# Patient Record
Sex: Female | Born: 1960
Health system: Southern US, Community
[De-identification: ages and names within clinical notes are randomized; demographics above are authoritative.]

## PROBLEM LIST (undated history)

## (undated) DIAGNOSIS — J45909 Unspecified asthma, uncomplicated: Secondary | ICD-10-CM

## (undated) DIAGNOSIS — E876 Hypokalemia: Secondary | ICD-10-CM

## (undated) DIAGNOSIS — D72819 Decreased white blood cell count, unspecified: Secondary | ICD-10-CM

## (undated) DIAGNOSIS — F419 Anxiety disorder, unspecified: Secondary | ICD-10-CM

## (undated) DIAGNOSIS — D649 Anemia, unspecified: Secondary | ICD-10-CM

## (undated) DIAGNOSIS — M7989 Other specified soft tissue disorders: Secondary | ICD-10-CM

## (undated) DIAGNOSIS — K5900 Constipation, unspecified: Secondary | ICD-10-CM

## (undated) DIAGNOSIS — I493 Ventricular premature depolarization: Secondary | ICD-10-CM

## (undated) DIAGNOSIS — I10 Essential (primary) hypertension: Secondary | ICD-10-CM

## (undated) DIAGNOSIS — R002 Palpitations: Secondary | ICD-10-CM

## (undated) HISTORY — DX: Constipation, unspecified: K59.00

## (undated) HISTORY — DX: Anxiety disorder, unspecified: F41.9

## (undated) HISTORY — DX: Unspecified asthma, uncomplicated: J45.909

## (undated) HISTORY — DX: Hypokalemia: E87.6

## (undated) HISTORY — DX: Anemia, unspecified: D64.9

## (undated) HISTORY — DX: Ventricular premature depolarization: I49.3

## (undated) HISTORY — DX: Other specified soft tissue disorders: M79.89

## (undated) HISTORY — DX: Essential (primary) hypertension: I10

## (undated) HISTORY — DX: Palpitations: R00.2

## (undated) HISTORY — DX: Decreased white blood cell count, unspecified: D72.819

---

## 1998-03-19 ENCOUNTER — Encounter: Payer: Self-pay | Admitting: *Deleted

## 1998-03-19 ENCOUNTER — Ambulatory Visit (HOSPITAL_COMMUNITY): Admission: RE | Admit: 1998-03-19 | Discharge: 1998-03-19 | Payer: Self-pay | Admitting: *Deleted

## 1998-12-25 ENCOUNTER — Other Ambulatory Visit: Admission: RE | Admit: 1998-12-25 | Discharge: 1998-12-25 | Payer: Self-pay | Admitting: Obstetrics and Gynecology

## 1999-12-31 ENCOUNTER — Other Ambulatory Visit: Admission: RE | Admit: 1999-12-31 | Discharge: 1999-12-31 | Payer: Self-pay | Admitting: Obstetrics and Gynecology

## 2000-03-08 ENCOUNTER — Encounter: Payer: Self-pay | Admitting: Obstetrics and Gynecology

## 2000-03-08 ENCOUNTER — Encounter: Admission: RE | Admit: 2000-03-08 | Discharge: 2000-03-08 | Payer: Self-pay | Admitting: Obstetrics and Gynecology

## 2000-03-10 ENCOUNTER — Encounter: Payer: Self-pay | Admitting: Obstetrics and Gynecology

## 2000-03-10 ENCOUNTER — Encounter: Admission: RE | Admit: 2000-03-10 | Discharge: 2000-03-10 | Payer: Self-pay | Admitting: Obstetrics and Gynecology

## 2001-03-13 ENCOUNTER — Other Ambulatory Visit: Admission: RE | Admit: 2001-03-13 | Discharge: 2001-03-13 | Payer: Self-pay | Admitting: Obstetrics and Gynecology

## 2001-03-16 ENCOUNTER — Encounter: Admission: RE | Admit: 2001-03-16 | Discharge: 2001-03-16 | Payer: Self-pay | Admitting: Obstetrics and Gynecology

## 2001-03-16 ENCOUNTER — Encounter: Payer: Self-pay | Admitting: Obstetrics and Gynecology

## 2001-06-18 ENCOUNTER — Encounter: Payer: Self-pay | Admitting: Family Medicine

## 2001-06-18 ENCOUNTER — Ambulatory Visit (HOSPITAL_COMMUNITY): Admission: RE | Admit: 2001-06-18 | Discharge: 2001-06-18 | Payer: Self-pay | Admitting: Family Medicine

## 2002-03-22 ENCOUNTER — Encounter: Payer: Self-pay | Admitting: Obstetrics and Gynecology

## 2002-03-22 ENCOUNTER — Encounter: Admission: RE | Admit: 2002-03-22 | Discharge: 2002-03-22 | Payer: Self-pay | Admitting: Obstetrics and Gynecology

## 2002-03-26 ENCOUNTER — Other Ambulatory Visit: Admission: RE | Admit: 2002-03-26 | Discharge: 2002-03-26 | Payer: Self-pay | Admitting: Obstetrics and Gynecology

## 2003-04-02 ENCOUNTER — Encounter: Admission: RE | Admit: 2003-04-02 | Discharge: 2003-04-02 | Payer: Self-pay | Admitting: Obstetrics and Gynecology

## 2003-04-09 ENCOUNTER — Other Ambulatory Visit: Admission: RE | Admit: 2003-04-09 | Discharge: 2003-04-09 | Payer: Self-pay | Admitting: Obstetrics and Gynecology

## 2004-04-02 ENCOUNTER — Encounter: Admission: RE | Admit: 2004-04-02 | Discharge: 2004-04-02 | Payer: Self-pay | Admitting: Obstetrics and Gynecology

## 2004-04-15 ENCOUNTER — Other Ambulatory Visit: Admission: RE | Admit: 2004-04-15 | Discharge: 2004-04-15 | Payer: Self-pay | Admitting: Obstetrics and Gynecology

## 2005-04-04 ENCOUNTER — Encounter: Admission: RE | Admit: 2005-04-04 | Discharge: 2005-04-04 | Payer: Self-pay | Admitting: Obstetrics and Gynecology

## 2005-05-12 ENCOUNTER — Other Ambulatory Visit: Admission: RE | Admit: 2005-05-12 | Discharge: 2005-05-12 | Payer: Self-pay | Admitting: Obstetrics and Gynecology

## 2006-04-21 ENCOUNTER — Encounter: Admission: RE | Admit: 2006-04-21 | Discharge: 2006-04-21 | Payer: Self-pay | Admitting: Obstetrics and Gynecology

## 2007-04-27 ENCOUNTER — Encounter: Admission: RE | Admit: 2007-04-27 | Discharge: 2007-04-27 | Payer: Self-pay | Admitting: Obstetrics and Gynecology

## 2008-05-02 ENCOUNTER — Encounter: Admission: RE | Admit: 2008-05-02 | Discharge: 2008-05-02 | Payer: Self-pay | Admitting: Obstetrics and Gynecology

## 2009-05-04 ENCOUNTER — Encounter: Admission: RE | Admit: 2009-05-04 | Discharge: 2009-05-04 | Payer: Self-pay | Admitting: Obstetrics and Gynecology

## 2009-09-18 ENCOUNTER — Ambulatory Visit (HOSPITAL_COMMUNITY): Admission: RE | Admit: 2009-09-18 | Discharge: 2009-09-18 | Payer: Self-pay | Admitting: Obstetrics and Gynecology

## 2009-12-15 HISTORY — PX: ENDOMETRIAL ABLATION: SHX621

## 2010-01-01 ENCOUNTER — Ambulatory Visit (HOSPITAL_COMMUNITY): Admission: RE | Admit: 2010-01-01 | Discharge: 2010-01-01 | Payer: Self-pay | Admitting: Obstetrics and Gynecology

## 2010-02-14 HISTORY — PX: OTHER SURGICAL HISTORY: SHX169

## 2010-03-31 ENCOUNTER — Other Ambulatory Visit: Payer: Self-pay | Admitting: Obstetrics and Gynecology

## 2010-03-31 DIAGNOSIS — Z1231 Encounter for screening mammogram for malignant neoplasm of breast: Secondary | ICD-10-CM

## 2010-04-27 LAB — BASIC METABOLIC PANEL
BUN: 10 mg/dL (ref 6–23)
Chloride: 102 mEq/L (ref 96–112)
Glucose, Bld: 95 mg/dL (ref 70–99)

## 2010-04-27 LAB — CBC
HCT: 35.2 % — ABNORMAL LOW (ref 36.0–46.0)
MCH: 29.6 pg (ref 26.0–34.0)
MCV: 87.3 fL (ref 78.0–100.0)
Platelets: 229 10*3/uL (ref 150–400)
RBC: 4.03 MIL/uL (ref 3.87–5.11)
RDW: 14.8 % (ref 11.5–15.5)

## 2010-04-27 LAB — PREGNANCY, URINE: Preg Test, Ur: NEGATIVE

## 2010-05-01 LAB — CBC
Hemoglobin: 11.3 g/dL — ABNORMAL LOW (ref 12.0–15.0)
MCH: 30.2 pg (ref 26.0–34.0)
MCHC: 34.6 g/dL (ref 30.0–36.0)
MCV: 87.3 fL (ref 78.0–100.0)
RBC: 3.73 MIL/uL — ABNORMAL LOW (ref 3.87–5.11)
RDW: 14.3 % (ref 11.5–15.5)
WBC: 3.8 10*3/uL — ABNORMAL LOW (ref 4.0–10.5)

## 2010-05-01 LAB — BASIC METABOLIC PANEL
BUN: 13 mg/dL (ref 6–23)
CO2: 28 mEq/L (ref 19–32)
Chloride: 105 mEq/L (ref 96–112)
Creatinine, Ser: 0.64 mg/dL (ref 0.4–1.2)
GFR calc Af Amer: >60 mL/min (ref >60)
Sodium: 136 mEq/L (ref 135–145)

## 2010-05-06 ENCOUNTER — Ambulatory Visit
Admission: RE | Admit: 2010-05-06 | Discharge: 2010-05-06 | Disposition: A | Discharge status: Home / Self Care | Payer: 59 | Source: Ambulatory Visit | Attending: Obstetrics and Gynecology | Admitting: Obstetrics and Gynecology

## 2010-05-06 DIAGNOSIS — Z1231 Encounter for screening mammogram for malignant neoplasm of breast: Secondary | ICD-10-CM

## 2011-03-29 ENCOUNTER — Other Ambulatory Visit: Payer: Self-pay | Admitting: Obstetrics and Gynecology

## 2011-03-29 DIAGNOSIS — Z1231 Encounter for screening mammogram for malignant neoplasm of breast: Secondary | ICD-10-CM

## 2011-05-16 ENCOUNTER — Ambulatory Visit
Admission: RE | Admit: 2011-05-16 | Discharge: 2011-05-16 | Disposition: A | Discharge status: Home / Self Care | Payer: 59 | Source: Ambulatory Visit | Attending: Obstetrics and Gynecology | Admitting: Obstetrics and Gynecology

## 2011-05-16 DIAGNOSIS — Z1231 Encounter for screening mammogram for malignant neoplasm of breast: Secondary | ICD-10-CM

## 2011-06-30 ENCOUNTER — Telehealth: Payer: Self-pay | Admitting: Oncology

## 2011-06-30 NOTE — Telephone Encounter (Signed)
S/w the pt to schedule an appt for her with dr ha. Pt was unaware of this and wants to speak with her primary care physician first before setting up any appts with [place]. gve the pt my name and phone number to call me back

## 2011-07-01 ENCOUNTER — Telehealth: Payer: Self-pay | Admitting: Oncology

## 2011-07-01 NOTE — Telephone Encounter (Signed)
Del.07/01/11 Ref. Dr. Cain Saupe Dx.Anemia

## 2011-07-01 NOTE — Telephone Encounter (Signed)
S/w the pt and she is aware of her new pt appt with dr ha °

## 2011-07-04 ENCOUNTER — Encounter: Payer: Self-pay | Admitting: Oncology

## 2011-07-04 ENCOUNTER — Ambulatory Visit: Payer: 59

## 2011-07-04 ENCOUNTER — Telehealth: Payer: Self-pay | Admitting: Oncology

## 2011-07-04 ENCOUNTER — Other Ambulatory Visit (HOSPITAL_BASED_OUTPATIENT_CLINIC_OR_DEPARTMENT_OTHER): Payer: 59 | Admitting: Lab

## 2011-07-04 ENCOUNTER — Ambulatory Visit (HOSPITAL_BASED_OUTPATIENT_CLINIC_OR_DEPARTMENT_OTHER): Payer: 59 | Admitting: Oncology

## 2011-07-04 VITALS — BP 155/79 | HR 59 | Temp 97.0°F | Ht 63.0 in | Wt 160.6 lb

## 2011-07-04 DIAGNOSIS — E876 Hypokalemia: Secondary | ICD-10-CM

## 2011-07-04 DIAGNOSIS — D649 Anemia, unspecified: Secondary | ICD-10-CM

## 2011-07-04 DIAGNOSIS — J309 Allergic rhinitis, unspecified: Secondary | ICD-10-CM | POA: Insufficient documentation

## 2011-07-04 DIAGNOSIS — D72819 Decreased white blood cell count, unspecified: Secondary | ICD-10-CM

## 2011-07-04 DIAGNOSIS — I10 Essential (primary) hypertension: Secondary | ICD-10-CM

## 2011-07-04 DIAGNOSIS — D72829 Elevated white blood cell count, unspecified: Secondary | ICD-10-CM

## 2011-07-04 LAB — CBC & DIFF AND RETIC
Immature Retic Fract: 3.3 % (ref 1.60–10.00)
MCH: 28.5 pg (ref 25.1–34.0)
MCV: 83.4 fL (ref 79.5–101.0)
MONO#: 0.5 10*3/uL (ref 0.1–0.9)
RBC: 4.04 10*6/uL (ref 3.70–5.45)
RDW: 15.3 % — ABNORMAL HIGH (ref 11.2–14.5)
Retic %: 1.31 % (ref 0.70–2.10)
Retic Ct Abs: 52.92 10*3/uL (ref 33.70–90.70)
lymph#: 1.6 10*3/uL (ref 0.9–3.3)

## 2011-07-04 LAB — MORPHOLOGY

## 2011-07-04 LAB — CHCC SMEAR

## 2011-07-04 NOTE — Progress Notes (Signed)
New patient today, with 1 insurance patient was informed about the financial assistance program etc; patient at this time did not need assistance, patient did ask if her insurance would be billed first and then the remaining balance would come to her for todays visit, I did tell that is what is normally done since she didn't have a co-pay.

## 2011-07-04 NOTE — Progress Notes (Signed)
Please see consult note; dated same day.   

## 2011-07-04 NOTE — Telephone Encounter (Signed)
appts made and printed for pt aom °

## 2011-07-04 NOTE — Patient Instructions (Signed)
A.  Issue:  Mild anemia:  Chronic.   - I sent work up to rule out reversible causes of anemia. - My nurse will contact you within one week if there are thinks that we can do to improve your anemia, according to blood work today.  B.  Follow up: - Lab-only appointment at the [facility] in 2 and then 4 months. - Follow up with Belenda Cruise, my nurse-practitioner in about 6 months.  - We may consider diagnostic bone marrow biopsy in the future if your anemia or WBC significantly worsen.  At this time, bone marrow biopsy has low clinical yield due to only mild low blood counts.

## 2011-07-04 NOTE — Consult Note (Signed)
[facility]  [phone] [phone]     INITIAL HEMATOLOGY CONSULTATION    Referral MD:  Dr. Cain Saupe, M.D.  Reason for Referral: anemia and leukopenia.     HPI:   [name] is a 51 year-old [place] American woman with history of HTN.  She recently switched PCP to Dr. Jillyn Hidden who realized that patient had mild anemia and leukopenia.  Oldest CBC provided for my review dated from 09/11/2009 where WBC was 3.8; Hgb 11.3; Plt 220.  The most recent CBC was from 06/27/2011 where WBC was 2.9; Hgb 11.3; Plt 186.  She was kindly referred to the [facility] for evaluation.  Ms. [name] presented to the clinic by herself today.  She reported that she feels well.  She denies fever, recurrent infection, visible source of bleeding.  She works out 7 days a week including Zumba.  She denies SOB, chest pain, dizziness with work out.  Patient denies fatigue, headache, visual changes, confusion, drenching night sweats, palpable lymph node swelling, mucositis, odynophagia, dysphagia, nausea vomiting, jaundice, chest pain, palpitation, shortness of breath, dyspnea on exertion, productive cough, gum bleeding, epistaxis, hematemesis, hemoptysis, abdominal pain, abdominal swelling, early satiety, melena, hematochezia, hematuria, skin rash, spontaneous bleeding, joint swelling, joint pain, heat or cold intolerance, bowel bladder incontinence, back pain, focal motor weakness, paresthesia.     Past Medical History  Diagnosis Date  . HTN (hypertension)   . Allergic rhinitis   . Hypokalemia   . Anemia   :    Past Surgical History  Procedure Date  . Endometrial ablation 12/2009  . Cesarean section     x2  . Colonoscopy 2012    reportedly negative per patient  :   CURRENT MEDS: Current Outpatient Prescriptions  Medication Sig Dispense Refill  . amLODipine (NORVASC) 5 MG tablet Take 5 mg by mouth daily.      . Biotin 1000 MCG tablet Take  1,000 mcg by mouth daily.      . calcium carbonate (OS-CAL) 600 MG TABS Take 600 mg by mouth 2 (two) times daily with a meal.      . COCONUT OIL PO Take by mouth daily.      . Evening Primrose Oil CAPS Take by mouth daily.      Marland Kitchen FLAXSEED, LINSEED, PO Take by mouth daily.      . Multiple Vitamin (MULTIVITAMIN) tablet Take 1 tablet by mouth daily.      Marland Kitchen spironolactone (ALDACTONE) 25 MG tablet Take 12.5 mg by mouth daily.          No Known Allergies:  Family History  Problem Relation Age of Onset  . Hypertension Mother   . Diabetes Mother   . Heart disease Mother   . Cancer Father 64    prostate  . Heart disease Sister   . Hypertension Sister   . Heart disease Brother   . Cancer Paternal Uncle     colon  :  History   Social History  . Marital Status: Single    Spouse Name: N/A    Number of Children: 2  . Years of Education: N/A   Occupational History  .  [employer]   Social History Main Topics  . Smoking status: Never Smoker   . Smokeless tobacco: Never Used  . Alcohol Use: No  . Drug Use: No  . Sexually Active:    Other Topics Concern  . Not on file  Social History Narrative  . No narrative on file  :  REVIEW OF SYSTEM:  The rest of the 14-point review of sytem was negative.   Exam: ECOG 0  General:  well-nourished woman, in no acute distress.  Eyes:  no scleral icterus.  ENT:  There were no oropharyngeal lesions.  Neck was without thyromegaly.  Lymphatics:  Negative cervical, supraclavicular or axillary adenopathy.  Respiratory: lungs were clear bilaterally without wheezing or crackles.  Cardiovascular:  Regular rate and rhythm, S1/S2, without murmur, rub or gallop.  There was no pedal edema.  GI:  abdomen was soft, flat, nontender, nondistended, without organomegaly.  Muscoloskeletal:  no spinal tenderness of palpation of vertebral spine.  There was no swelling or pain in palpation of her finger joints.  Skin exam was without  echymosis, petichae or rash.   Neuro exam was nonfocal.  Patient was able to get on and off exam table without assistance.  Gait was normal.  Patient was alerted and oriented.  Attention was good.   Language was appropriate.  Mood was normal without depression.  Speech was not pressured.  Thought content was not tangential.    LABS:  Lab Results  Component Value Date   WBC 3.6* 07/04/2011   HGB 11.5* 07/04/2011   HCT 33.7* 07/04/2011   PLT 222 07/04/2011   GLUCOSE 95 12/29/2009   NA 136 12/29/2009   K 3.5 12/29/2009   CL 102 12/29/2009   CREATININE 0.70 12/29/2009   BUN 10 12/29/2009   CO2 28 12/29/2009   Iron 35; % sat 11; Ferritin 40; Vit B12:  1278.   Blood smear review:   I personally reviewed the patient's peripheral blood smear today.  There was isocytosis.  There was no peripheral blast.  There was no schistocytosis, spherocytosis, target cell, rouleaux formation, tear drop cell.  There was no giant platelets or platelet clumps.      ASSESSMENT AND PLAN:   1.  Hypertension:  Slightly elevated SBP today.  Per her report, her normal blood pressure is <120 systolic.  This is most likely white coat due to 1st time at the [facility].  She is on amlodipine and spironolactone per PCP.   2.  Normocytic anemia:  She has no family history of hemoglobinopathy.  Her iron panel and Vit B12 today are not consistent with deficiency.  She reportedly had a negative colonoscopy in 2012.  Her retic count today was normal making less likely to be hemolysis or primary bone marrow failure.   She has a distant relative with SLE.  Clinically, she has no signs and symptoms of SLE.  However, in the future, if her cytopenia worsens, I may consider screening for SLE.  I have low clinical suspicion for bone marrow failure process such as aplasia or MDS since her cytopenia is so mild.  A diagnostic bone marrow biopsy at this time would be of low clinical utility.  If her Hgb significantly worsens to <10  without obvious explanation, then a diagnostic bone marrow biopsy may be considered at that time.   3.  Leukopenia:  Most likely benign since it has been on going for at least 2 years.  My review of her blood smear today was rather benign.  Again, if her neutropenia worsens to <1, I may consider further work up.   4.  Follow up: - CBC here at the Cancer Center in 2 and 4 months.  She has follow up with Clenton Pare, NP in about  6 months.    Thank you for this referral.    The length of time of the face-to-face encounter was 30 minutes. More than 50% of time was spent counseling and coordination of care.

## 2011-07-06 LAB — IRON AND TIBC
%SAT: 11 % — ABNORMAL LOW (ref 20–55)
Iron: 35 ug/dL — ABNORMAL LOW (ref 42–145)
TIBC: 330 ug/dL (ref 250–470)

## 2011-07-06 LAB — PROTEIN ELECTROPHORESIS, SERUM: Albumin ELP: 56 % (ref 55.8–66.1)

## 2011-07-06 LAB — FERRITIN: Ferritin: 40 ng/mL (ref 10–291)

## 2011-09-05 ENCOUNTER — Other Ambulatory Visit: Payer: 59 | Admitting: Lab

## 2011-11-07 ENCOUNTER — Other Ambulatory Visit: Payer: 59 | Admitting: Lab

## 2012-01-04 ENCOUNTER — Other Ambulatory Visit (HOSPITAL_BASED_OUTPATIENT_CLINIC_OR_DEPARTMENT_OTHER): Payer: 59 | Admitting: Lab

## 2012-01-04 ENCOUNTER — Ambulatory Visit (HOSPITAL_BASED_OUTPATIENT_CLINIC_OR_DEPARTMENT_OTHER): Payer: 59 | Admitting: Oncology

## 2012-01-04 ENCOUNTER — Encounter: Payer: Self-pay | Admitting: Oncology

## 2012-01-04 ENCOUNTER — Telehealth: Payer: Self-pay | Admitting: Oncology

## 2012-01-04 VITALS — BP 181/90 | HR 63 | Temp 97.5°F | Resp 20 | Ht 63.0 in | Wt 159.9 lb

## 2012-01-04 DIAGNOSIS — I10 Essential (primary) hypertension: Secondary | ICD-10-CM

## 2012-01-04 DIAGNOSIS — D649 Anemia, unspecified: Secondary | ICD-10-CM

## 2012-01-04 DIAGNOSIS — E876 Hypokalemia: Secondary | ICD-10-CM

## 2012-01-04 DIAGNOSIS — D72819 Decreased white blood cell count, unspecified: Secondary | ICD-10-CM

## 2012-01-04 HISTORY — DX: Decreased white blood cell count, unspecified: D72.819

## 2012-01-04 LAB — CBC WITH DIFFERENTIAL/PLATELET
BASO%: 0.3 % (ref 0.0–2.0)
EOS%: 1 % (ref 0.0–7.0)
LYMPH%: 42.4 % (ref 14.0–49.7)
MCH: 28.9 pg (ref 25.1–34.0)
MCHC: 33.7 g/dL (ref 31.5–36.0)
MCV: 85.8 fL (ref 79.5–101.0)
MONO%: 13.1 % (ref 0.0–14.0)
Platelets: 199 10*3/uL (ref 145–400)
RBC: 4.29 10*6/uL (ref 3.70–5.45)
RDW: 14.4 % (ref 11.2–14.5)

## 2012-01-04 LAB — COMPREHENSIVE METABOLIC PANEL (CC13)
BUN: 13 mg/dL (ref 7.0–26.0)
CO2: 32 mEq/L — ABNORMAL HIGH (ref 22–29)
Creatinine: 0.8 mg/dL (ref 0.6–1.1)
Glucose: 86 mg/dl (ref 70–99)
Sodium: 142 mEq/L (ref 136–145)
Total Bilirubin: 0.64 mg/dL (ref 0.20–1.20)
Total Protein: 7.4 g/dL (ref 6.4–8.3)

## 2012-01-04 LAB — MORPHOLOGY

## 2012-01-04 NOTE — Telephone Encounter (Signed)
appts made and printed for pt  °

## 2012-01-04 NOTE — Patient Instructions (Addendum)
1.  Diagnosis:  Anemia and leukopenia (low white blood cell) 2.  Impression:  Stable; chronic.  Most likely benign with low clinical suspicion for primary bone marrow disease.  3.  Recommendation:  Watchful observation.  In the future, if there is significantly worsened low white blood cells, anemia, or low platelet count, then we may consider diagnostic bone marrow biopsy.  4.  Follow up:  Lab only appointment in about 6 months.  Return visit in about 1 year. In the future, if blood count remains low but stable, we may consider discharging from the Cancer Center with follow up with primary care physician alone.

## 2012-01-04 NOTE — Progress Notes (Signed)
[facility]  Telephone:(336) [phone] Fax:(336) [phone]   OFFICE PROGRESS NOTE   Cc:  FULP, CAMMIE, MD  DIAGNOSIS: leukopenia; most likely benign.  PAST THERAPY:  None.   CURRENT THERAPY:  Watchful observation.   INTERVAL HISTORY: Stephanie White 51 y.o. female returns for regular follow up by herself.  She reports mild fatigue.  However, she still works out 3x/week with zumba.    Patient denies fever, anorexia, weight loss, headache, visual changes, confusion, drenching night sweats, palpable lymph node swelling, mucositis, odynophagia, dysphagia, nausea vomiting, jaundice, chest pain, palpitation, shortness of breath, dyspnea on exertion, productive cough, gum bleeding, epistaxis, hematemesis, hemoptysis, abdominal pain, abdominal swelling, early satiety, melena, hematochezia, hematuria, skin rash, spontaneous bleeding, joint swelling, joint pain, heat or cold intolerance, bowel bladder incontinence, back pain, focal motor weakness, paresthesia, depression, suicidal or homicidal ideation, feeling hopelessness.   Past Medical History  Diagnosis Date  . HTN (hypertension)   . Allergic rhinitis   . Hypokalemia   . Anemia   . Leukopenia 01/04/2012    Past Surgical History  Procedure Date  . Endometrial ablation 12/2009  . Cesarean section     x2  . Colonoscopy 2012    reportedly negative per patient    Current Outpatient Prescriptions  Medication Sig Dispense Refill  . amLODipine (NORVASC) 5 MG tablet Take 5 mg by mouth daily.      . Biotin 1000 MCG tablet Take 1,000 mcg by mouth daily.      Marland Kitchen FLAXSEED, LINSEED, PO Take by mouth daily.      . Multiple Vitamin (MULTIVITAMIN) tablet Take 1 tablet by mouth daily.      Marland Kitchen spironolactone (ALDACTONE) 25 MG tablet Take 12.5 mg by mouth daily.        ALLERGIES:   has no known allergies.  REVIEW OF SYSTEMS:  The rest of the 14-point review of system was negative.   Filed Vitals:   01/04/12 0927  BP:  181/90  Pulse: 63  Temp: 97.5 F (36.4 C)  Resp: 20   Wt Readings from Last 3 Encounters:  01/04/12 159 lb 14.4 oz (72.53 kg)  07/04/11 160 lb 9.6 oz (72.848 kg)   ECOG Performance status: 0  PHYSICAL EXAMINATION:   General:  well-nourished woman, in no acute distress.  Eyes:  no scleral icterus.  ENT:  There were no oropharyngeal lesions.  Neck was without thyromegaly.  Lymphatics:  Negative cervical, supraclavicular or axillary adenopathy.  Respiratory: lungs were clear bilaterally without wheezing or crackles.  Cardiovascular:  Regular rate and rhythm, S1/S2, without murmur, rub or gallop.  There was no pedal edema.  GI:  abdomen was soft, flat, nontender, nondistended, without organomegaly.  Muscoloskeletal:  no spinal tenderness of palpation of vertebral spine.  Skin exam was without echymosis, petichae.  Neuro exam was nonfocal.  Patient was able to get on and off exam table without assistance.  Gait was normal.  Patient was alerted and oriented.  Attention was good.   Language was appropriate.  Mood was normal without depression.  Speech was not pressured.  Thought content was not tangential.      LABORATORY/RADIOLOGY DATA:  Lab Results  Component Value Date   WBC 2.9* 01/04/2012   HGB 12.4 01/04/2012   HCT 36.8 01/04/2012   PLT 199 01/04/2012   GLUCOSE 86 01/04/2012   ALKPHOS 72 01/04/2012   ALT 21 01/04/2012   AST 34 01/04/2012   NA 142 01/04/2012   K 3.3* 01/04/2012  CL 104 01/04/2012   CREATININE 0.8 01/04/2012   BUN 13.0 01/04/2012   CO2 32* 01/04/2012   I personally reviewed the patient's peripheral blood smear today.  There was isocytosis.  There was no peripheral blast.  There was no schistocytosis, spherocytosis, target cell, rouleaux formation, tear drop cell.  There was no giant platelets or platelet clumps.      ASSESSMENT AND PLAN:    1.  Diagnosis:  Leukopenia  2.  Impression:  Stable; chronic.  Most likely benign with low clinical suspicion for  primary bone marrow disease.  3.  Recommendation:  Watchful observation.  In the future, if there is significantly worsened leukopenia/neutropenia, anemia, or thrombocytopenia, then we may consider diagnostic bone marrow biopsy.  A bone marrow biopsy at this time has low clinical utility in my opinion.  4.  Follow up:  Lab only appointment in about 6 months.  Return visit in about 1 year. In the future, if blood count remains low but stable, we may consider discharging from the Cancer Center with follow up with primary care physician alone.   Patient expressed informed understanding and agreed with watchful observation at this time.    The length of time of the face-to-face encounter was 10  minutes. More than 50% of time was spent counseling and coordination of care.

## 2012-04-09 ENCOUNTER — Other Ambulatory Visit (HOSPITAL_COMMUNITY): Payer: Self-pay | Admitting: Otolaryngology

## 2012-04-09 DIAGNOSIS — R599 Enlarged lymph nodes, unspecified: Secondary | ICD-10-CM

## 2012-04-10 ENCOUNTER — Other Ambulatory Visit: Payer: Self-pay | Admitting: Radiology

## 2012-04-13 ENCOUNTER — Ambulatory Visit (HOSPITAL_COMMUNITY)
Admission: RE | Admit: 2012-04-13 | Discharge: 2012-04-13 | Disposition: A | Discharge status: Home / Self Care | Payer: 59 | Source: Ambulatory Visit | Attending: Otolaryngology | Admitting: Otolaryngology

## 2012-04-13 ENCOUNTER — Other Ambulatory Visit: Payer: Self-pay | Admitting: Family Medicine

## 2012-04-13 ENCOUNTER — Ambulatory Visit (HOSPITAL_COMMUNITY): Admission: RE | Admit: 2012-04-13 | Payer: 59 | Source: Ambulatory Visit

## 2012-04-13 ENCOUNTER — Other Ambulatory Visit: Payer: Self-pay

## 2012-04-13 DIAGNOSIS — Z1231 Encounter for screening mammogram for malignant neoplasm of breast: Secondary | ICD-10-CM

## 2012-04-13 DIAGNOSIS — N951 Menopausal and female climacteric states: Secondary | ICD-10-CM

## 2012-04-13 DIAGNOSIS — R599 Enlarged lymph nodes, unspecified: Secondary | ICD-10-CM | POA: Insufficient documentation

## 2012-04-13 MED ORDER — FENTANYL CITRATE 0.05 MG/ML IJ SOLN
INTRAMUSCULAR | Status: AC
  Filled 2012-04-13: qty 4

## 2012-04-13 MED ORDER — MIDAZOLAM HCL 2 MG/2ML IJ SOLN
INTRAMUSCULAR | Status: AC
  Filled 2012-04-13: qty 4

## 2012-04-20 ENCOUNTER — Other Ambulatory Visit (HOSPITAL_COMMUNITY): Payer: 59

## 2012-05-18 ENCOUNTER — Ambulatory Visit
Admission: RE | Admit: 2012-05-18 | Discharge: 2012-05-18 | Disposition: A | Discharge status: Home / Self Care | Payer: 59 | Source: Ambulatory Visit | Attending: Family Medicine | Admitting: Family Medicine

## 2012-05-18 DIAGNOSIS — Z1231 Encounter for screening mammogram for malignant neoplasm of breast: Secondary | ICD-10-CM

## 2012-05-18 DIAGNOSIS — N951 Menopausal and female climacteric states: Secondary | ICD-10-CM

## 2012-07-04 ENCOUNTER — Other Ambulatory Visit: Payer: 59

## 2013-01-01 ENCOUNTER — Telehealth: Payer: Self-pay | Admitting: Oncology

## 2013-01-01 NOTE — Telephone Encounter (Signed)
pt called to cx appt....did not want to r/s °

## 2013-01-02 ENCOUNTER — Ambulatory Visit: Payer: 59 | Admitting: Hematology and Oncology

## 2013-01-02 ENCOUNTER — Other Ambulatory Visit: Payer: 59 | Admitting: Lab

## 2013-01-16 ENCOUNTER — Telehealth: Payer: Self-pay | Admitting: *Deleted

## 2013-01-16 NOTE — Telephone Encounter (Signed)
Pt left VM,  She is former pt of Dr. Gaylyn Rong. States her PCP, Dr. Cain Saupe, suggested she make appt to see Hematologist again and said they sent over copy of recent labs to Dr. Bertis Ruddy for review.  Have not received labs yet.  Left pt VM that I will give labs to Dr. Bertis Ruddy when we receive them and then will call her about appt.Marland Kitchen

## 2013-01-17 ENCOUNTER — Telehealth: Payer: Self-pay | Admitting: *Deleted

## 2013-01-17 NOTE — Telephone Encounter (Signed)
CBC reviewed from Dr [facility] office and forwarded to Dr. Bertis Ruddy for review.

## 2013-01-17 NOTE — Telephone Encounter (Signed)
OK - thanks

## 2013-01-18 ENCOUNTER — Telehealth: Payer: Self-pay | Admitting: Hematology and Oncology

## 2013-01-18 NOTE — Telephone Encounter (Signed)
pt rs from 1212 to 1218 shh

## 2013-01-18 NOTE — Telephone Encounter (Signed)
[facility] appt made w Dr Bertis Ruddy for 12/12 per 12/5 POF shh

## 2013-01-18 NOTE — Telephone Encounter (Signed)
Per Dr. Bertis Ruddy,  Schedule pt for routine f/u.  Left pt Vm informing her of order to schedule for office visit w/i one to two weeks.  Expect call from Scheduling and call us back if any questions.

## 2013-01-25 ENCOUNTER — Ambulatory Visit: Payer: 59 | Admitting: Hematology and Oncology

## 2013-01-31 ENCOUNTER — Ambulatory Visit (HOSPITAL_BASED_OUTPATIENT_CLINIC_OR_DEPARTMENT_OTHER): Payer: 59 | Admitting: Hematology and Oncology

## 2013-01-31 ENCOUNTER — Encounter: Payer: Self-pay | Admitting: Hematology and Oncology

## 2013-01-31 VITALS — BP 151/71 | HR 98 | Temp 97.6°F | Resp 19 | Ht 63.0 in | Wt 163.6 lb

## 2013-01-31 DIAGNOSIS — D72819 Decreased white blood cell count, unspecified: Secondary | ICD-10-CM

## 2013-01-31 DIAGNOSIS — D649 Anemia, unspecified: Secondary | ICD-10-CM

## 2013-01-31 DIAGNOSIS — D509 Iron deficiency anemia, unspecified: Secondary | ICD-10-CM

## 2013-01-31 NOTE — Progress Notes (Signed)
[facility] OFFICE PROGRESS NOTE  FULP, CAMMIE, MD DIAGNOSIS:  Chronic leukopenia  SUMMARY OF HEMATOLOGIC HISTORY: This is a patient who has chronic leukopenia without history of recurrent infection. She was being observed. INTERVAL HISTORY: Stephanie White 52 y.o. female returns for further followup. She denies any recent infection. She denies any recent fever, chills, night sweats or abnormal weight loss She was recently placed on oral iron supplement due to mild anemia. Her recent colonoscopy 3 years ago was negative. The patient denies any recent signs or symptoms of bleeding such as spontaneous epistaxis, hematuria or hematochezia.  I have reviewed the past medical history, past surgical history, social history and family history with the patient and they are unchanged from previous note.  ALLERGIES:  is allergic to latex.  MEDICATIONS:  Current Outpatient Prescriptions  Medication Sig Dispense Refill  . EXFORGE 10-160 MG per tablet Take 1 tablet by mouth daily.      . ferrous sulfate 325 (65 FE) MG tablet Take 325 mg by mouth every other day.      . Fish Oil OIL by Does not apply route 2 (two) times daily.      . Multiple Vitamin (MULTIVITAMIN) tablet Take 1 tablet by mouth daily.       No current facility-administered medications for this visit.     REVIEW OF SYSTEMS:   Constitutional: Denies fevers, chills or night sweats Behavioral/Psych: Mood is stable, no new changes  All other systems were reviewed with the patient and are negative.  PHYSICAL EXAMINATION: ECOG PERFORMANCE STATUS: 0 - Asymptomatic  Filed Vitals:   01/31/13 1406  BP: 151/71  Pulse: 98  Temp: 97.6 F (36.4 C)  Resp: 19   Filed Weights   01/31/13 1406  Weight: 163 lb 9.6 oz (74.208 kg)    GENERAL:alert, no distress and comfortable SKIN: skin color, texture, turgor are normal, no rashes or significant lesions EYES: normal, Conjunctiva are pink and non-injected, sclera  clear Musculoskeletal:no cyanosis of digits and no clubbing  NEURO: alert & oriented x 3 with fluent speech, no focal motor/sensory deficits  LABORATORY DATA:  I have reviewed the data as listed No results found for this or any previous visit (from the past 48 hour(s)).  Lab Results  Component Value Date   WBC 2.9* 01/04/2012   HGB 12.4 01/04/2012   HCT 36.8 01/04/2012   MCV 85.8 01/04/2012   PLT 199 01/04/2012   ASSESSMENT & PLAN:  #1 chronic leukopenia This is likely due to her African American heritage. I do not recommend further workup as the patient is not symptomatic #2 history of mild iron deficiency Causes is unknown. Her anemia has improved with iron replacement. I recommend a recheck ferritin in 3 months and discontinue if her ferritin level is more than 50. I educated the patient's signs and symptoms to watch out for infection. The patient does not need to return for further followup appointment. All questions were answered. The patient knows to call the clinic with any problems, questions or concerns. No barriers to learning was detected.  I spent 15 minutes counseling the patient face to face. The total time spent in the appointment was 20 minutes and more than 50% was on counseling.     [facility], Payeton Germani, MD 01/31/2013 2:46 PM

## 2013-04-16 ENCOUNTER — Other Ambulatory Visit: Payer: Self-pay

## 2013-04-16 DIAGNOSIS — Z1231 Encounter for screening mammogram for malignant neoplasm of breast: Secondary | ICD-10-CM

## 2013-05-04 ENCOUNTER — Encounter: Payer: Self-pay | Admitting: Cardiology

## 2013-05-16 ENCOUNTER — Ambulatory Visit: Payer: 59 | Admitting: Cardiology

## 2013-05-22 ENCOUNTER — Ambulatory Visit
Admission: RE | Admit: 2013-05-22 | Discharge: 2013-05-22 | Disposition: A | Discharge status: Home / Self Care | Payer: 59 | Source: Ambulatory Visit

## 2013-05-22 DIAGNOSIS — Z1231 Encounter for screening mammogram for malignant neoplasm of breast: Secondary | ICD-10-CM

## 2013-05-27 ENCOUNTER — Encounter: Payer: Self-pay | Admitting: Cardiology

## 2013-05-27 ENCOUNTER — Ambulatory Visit (INDEPENDENT_AMBULATORY_CARE_PROVIDER_SITE_OTHER): Payer: 59 | Admitting: Cardiology

## 2013-05-27 VITALS — BP 130/70 | HR 57 | Ht 63.0 in | Wt 170.0 lb

## 2013-05-27 DIAGNOSIS — R9431 Abnormal electrocardiogram [ECG] [EKG]: Secondary | ICD-10-CM

## 2013-05-27 DIAGNOSIS — R064 Hyperventilation: Secondary | ICD-10-CM

## 2013-05-27 DIAGNOSIS — F411 Generalized anxiety disorder: Secondary | ICD-10-CM

## 2013-05-27 DIAGNOSIS — F419 Anxiety disorder, unspecified: Secondary | ICD-10-CM

## 2013-05-27 DIAGNOSIS — R002 Palpitations: Secondary | ICD-10-CM

## 2013-05-27 DIAGNOSIS — I10 Essential (primary) hypertension: Secondary | ICD-10-CM

## 2013-05-27 NOTE — Progress Notes (Signed)
[place]. [address], [place], [place]  [address] Phone: [phone] Fax:  [phone]  Date:  05/27/2013   ID:  Stephanie White, DOB April 16, 1960, MRN [id number]  PCP:  Antony Blackbird, MD   History of Present Illness: Stephanie White is a 53 y.o. female here for evaluation of palpitations, described as a fluttering sensation in her chest. In review of Dr. Siri Cole note on 04/16/13 she was feeling palpitations as well as numbness in her upper arm and tingling that occurs sometimes at work, stressful even though she is sitting behind computer. When she has these episodes, sometimes she feels as though she is not breathing. She had episode of waking up feeling nauseous. She was concerned about possible heart attack after watching an episode of Dr. Irena Cords.  Potassium 3.4, creatinine 0.91, H. pylori negative, hemoglobin 12.1, TSH 1.8.   Wt Readings from Last 3 Encounters:  05/27/13 170 lb (77.111 kg)  01/31/13 163 lb 9.6 oz (74.208 kg)  01/04/12 159 lb 14.4 oz (72.53 kg)     Past Medical History  Diagnosis Date  . HTN (hypertension)   . Allergic rhinitis   . Hypokalemia   . Anemia   . Leukopenia 01/04/2012    Past Surgical History  Procedure Laterality Date  . Endometrial ablation  12/2009  . Cesarean section      x2  . Colonoscopy  2012    reportedly negative per patient    Current Outpatient Prescriptions  Medication Sig Dispense Refill  . EXFORGE 10-160 MG per tablet Take 1 tablet by mouth daily.      . ferrous sulfate 325 (65 FE) MG tablet Take 325 mg by mouth every other day.      . Fish Oil OIL by Does not apply route 2 (two) times daily.      . Multiple Vitamin (MULTIVITAMIN) tablet Take 1 tablet by mouth daily.       No current facility-administered medications for this visit.   FHX: Brother had CHF.  Allergies:    Allergies  Allergen Reactions  . Latex Rash    Social History:  The patient  reports that she has never smoked. She has never used  smokeless tobacco. She reports that she does not drink alcohol or use illicit drugs.   ROS:  Please see the history of present illness.   Denies any fevers, chills, orthopnea, PND  . No chest pain, rashes, syncope, orthopnea. Unless explained above, all other review of systems negative.  PHYSICAL EXAM: VS:  BP 130/70  Pulse 57  Ht 5\' 3"  (1.6 m)  Wt 170 lb (77.111 kg)  BMI 30.12 kg/m2 Well nourished, well developed, in no acute distress HEENT: normalNCAT Neck: no JVDNo bruits, carotid. Cardiac:  normal S1, S2; RRR; no murmur Lungs:  clear to auscultation bilaterally, no wheezing, rhonchi or rales Abd: soft, nontender, no hepatomegaly Ext: no edemaNormal pulse bilaterally Skin: warm and dry Neuro: no focal abnormalities noted, moves all extremities, normal gait GU: Deferred Rectal: Deferred  EKG:  05/27/13 -Sinus bradycardia rate 57 with nonspecific ST-T wave changes, subtle T-wave inversion in lead V2 and flattening in the remainder of the precordial leads.     Lab work-reviewed as above. Prior medical records reviewed.  ASSESSMENT AND PLAN:  1. Palpitations-likely anxiety related. She describes the sensation of perioral numbness or paresthesias that often can accompany hyperventilation. After discussing this with her, she does admit that she feels as though during these episodes that she  is not breathing or breathing correctly. She will focus on deep breathing/relaxation patterns. If palpitations worsen, consider monitoring. Her EKG showing subtle abnormalities as described above, I will check an exercise treadmill test to ensure that there no signs of overt ischemia. She does exercise fairly often and does not have any exertional symptoms. 2. Anxiety/stress - as above. Likely hyperventilation causing perioral numbness. 3. Abnormal ECG - as above.  4. Hypertension - well-controlled.  Signed, Candee Furbish, MD [facility]  05/27/2013 3:03 PM

## 2013-05-27 NOTE — Patient Instructions (Signed)
Your physician recommends that you continue on your current medications as directed. Please refer to the Current Medication list given to you today.  Your physician has requested that you have an exercise tolerance test. For further information please visit [email or web address]. Please also follow instruction sheet, as given.  Your physician recommends that you schedule a follow-up as needed

## 2013-07-03 ENCOUNTER — Encounter: Payer: 59 | Admitting: Physician Assistant

## 2014-01-08 ENCOUNTER — Ambulatory Visit (INDEPENDENT_AMBULATORY_CARE_PROVIDER_SITE_OTHER): Payer: 59 | Admitting: Podiatry

## 2014-01-08 ENCOUNTER — Encounter: Payer: Self-pay | Admitting: Podiatry

## 2014-01-08 ENCOUNTER — Ambulatory Visit: Payer: Self-pay | Admitting: Podiatry

## 2014-01-08 ENCOUNTER — Ambulatory Visit (INDEPENDENT_AMBULATORY_CARE_PROVIDER_SITE_OTHER): Payer: 59

## 2014-01-08 DIAGNOSIS — M779 Enthesopathy, unspecified: Secondary | ICD-10-CM

## 2014-01-08 DIAGNOSIS — R52 Pain, unspecified: Secondary | ICD-10-CM

## 2014-01-08 DIAGNOSIS — M7741 Metatarsalgia, right foot: Secondary | ICD-10-CM

## 2014-01-08 DIAGNOSIS — M7742 Metatarsalgia, left foot: Secondary | ICD-10-CM

## 2014-01-08 DIAGNOSIS — M722 Plantar fascial fibromatosis: Secondary | ICD-10-CM

## 2014-01-08 NOTE — Progress Notes (Signed)
   Subjective:    Patient ID: Stephanie White, female    DOB: 1960/12/26, 53 y.o.   MRN: [id number]  HPI  53 year old female presents the office today with complaints of bilateral foot pain which has been ongoing for approximately one month. She states that she has pain in the ball of her feet as well as her heels. She states that she has tried multiple pairs of shoes to help alleviate her symptoms however she continues to have pain. She is active in exercise although she has not increased her activity over the past couple months. She denies any history of injury or trauma to the area. She states that the right is worse than the left. She denies ever having any swelling or any increase in warmth to the feet. She states that she has pain in her heels particularly in the morning or after periods of rest and then again after prolonged activity. She states that she has pain in the ball of her foot with pressure and ambulation. No other complaints at this time.    Review of Systems  Musculoskeletal: Positive for back pain.       Objective:   Physical Exam AAO x3, NAD DP/PT pulses palpable bilaterally, CRT less than 3 seconds Protective sensation intact with Simms Weinstein monofilament, vibratory sensation intact, Achilles tendon reflex intact Tenderness is patient of the plantar medial tubercle of the calcaneus at the insertion the plantar fascia bilaterally. There is no pain with lateral compression of the calcaneus or the vibratory sensation. There is no pain along the course of the posterior aspect of the calcaneus or along the course/insertion of the Achilles tendon. There is no pain along the course of the plantar fascia within the arch of the foot. There is diffuse tenderness to bilateral metatarsal heads 1 through 5 plantarly. There is no overlying edema, erythema, increase in warmth of bilateral foot/ankle. There is mild tenderness along the course of posterior tibial tendon bilaterally  along the insertion into the navicular. Mild decrease in medial arch height upon weightbearing. Mild equinus. MMT 5/5, ROM WNL No pain with calf compression, swelling, warmth, erythema. No open lesions or pre-ulcerative lesions.       Assessment & Plan:  53 year old female with bilateral heel pain, likely plantar fasciitis and diffuse bilateral plantar metatarsal head pain. -X-rays were obtained and reviewed with the patient. -Treatment options were discussed including alternatives, risks, complications. -Discussed the patient possible stress fracture. She does not want any form of immobilization at this time. -Discussed injection to the area however she does not wish to proceed with any steroid injection. She also does not want any oral anti-inflammatory medications. -Discussed with her stretching exercises as well as ice to the area. -Dispensed plantar fascial brace. -Follow-up in 2 weeks. In the meantime, call the office in the questions, concerns, changes symptoms. We'll obtain repeat x-rays next appointment.

## 2014-01-08 NOTE — Patient Instructions (Signed)
Plantar Fasciitis (Heel Spur Syndrome) with Rehab The plantar fascia is a fibrous, ligament-like, soft-tissue structure that spans the bottom of the foot. Plantar fasciitis is a condition that causes pain in the foot due to inflammation of the tissue. SYMPTOMS   Pain and tenderness on the underneath side of the foot.  Pain that worsens with standing or walking. CAUSES  Plantar fasciitis is caused by irritation and injury to the plantar fascia on the underneath side of the foot. Common mechanisms of injury include:  Direct trauma to bottom of the foot.  Damage to a small nerve that runs under the foot where the main fascia attaches to the heel bone.  Stress placed on the plantar fascia due to bone spurs. RISK INCREASES WITH:   Activities that place stress on the plantar fascia (running, jumping, pivoting, or cutting).  Poor strength and flexibility.  Improperly fitted shoes.  Tight calf muscles.  Flat feet.  Failure to warm-up properly before activity.  Obesity. PREVENTION  Warm up and stretch properly before activity.  Allow for adequate recovery between workouts.  Maintain physical fitness:  Strength, flexibility, and endurance.  Cardiovascular fitness.  Maintain a health body weight.  Avoid stress on the plantar fascia.  Wear properly fitted shoes, including arch supports for individuals who have flat feet. PROGNOSIS  If treated properly, then the symptoms of plantar fasciitis usually resolve without surgery. However, occasionally surgery is necessary. RELATED COMPLICATIONS   Recurrent symptoms that may result in a chronic condition.  Problems of the lower back that are caused by compensating for the injury, such as limping.  Pain or weakness of the foot during push-off following surgery.  Chronic inflammation, scarring, and partial or complete fascia tear, occurring more often from repeated injections. TREATMENT  Treatment initially involves the use of  ice and medication to help reduce pain and inflammation. The use of strengthening and stretching exercises may help reduce pain with activity, especially stretches of the Achilles tendon. These exercises may be performed at home or with a therapist. Your caregiver may recommend that you use heel cups of arch supports to help reduce stress on the plantar fascia. Occasionally, corticosteroid injections are given to reduce inflammation. If symptoms persist for greater than 6 months despite non-surgical (conservative), then surgery may be recommended.  MEDICATION   If pain medication is necessary, then nonsteroidal anti-inflammatory medications, such as aspirin and ibuprofen, or other minor pain relievers, such as acetaminophen, are often recommended.  Do not take pain medication within 7 days before surgery.  Prescription pain relievers may be given if deemed necessary by your caregiver. Use only as directed and only as much as you need.  Corticosteroid injections may be given by your caregiver. These injections should be reserved for the most serious cases, because they may only be given a certain number of times. HEAT AND COLD  Cold treatment (icing) relieves pain and reduces inflammation. Cold treatment should be applied for 10 to 15 minutes every 2 to 3 hours for inflammation and pain and immediately after any activity that aggravates your symptoms. Use ice packs or massage the area with a piece of ice (ice massage).  Heat treatment may be used prior to performing the stretching and strengthening activities prescribed by your caregiver, physical therapist, or athletic trainer. Use a heat pack or soak the injury in warm water. SEEK IMMEDIATE MEDICAL CARE IF:  Treatment seems to offer no benefit, or the condition worsens.  Any medications produce adverse side effects. EXERCISES RANGE   OF MOTION (ROM) AND STRETCHING EXERCISES - Plantar Fasciitis (Heel Spur Syndrome) These exercises may help you  when beginning to rehabilitate your injury. Your symptoms may resolve with or without further involvement from your physician, physical therapist or athletic trainer. While completing these exercises, remember:   Restoring tissue flexibility helps normal motion to return to the joints. This allows healthier, less painful movement and activity.  An effective stretch should be held for at least 30 seconds.  A stretch should never be painful. You should only feel a gentle lengthening or release in the stretched tissue. RANGE OF MOTION - Toe Extension, Flexion  Sit with your right / left leg crossed over your opposite knee.  Grasp your toes and gently pull them back toward the top of your foot. You should feel a stretch on the bottom of your toes and/or foot.  Hold this stretch for __________ seconds.  Now, gently pull your toes toward the bottom of your foot. You should feel a stretch on the top of your toes and or foot.  Hold this stretch for __________ seconds. Repeat __________ times. Complete this stretch __________ times per day.  RANGE OF MOTION - Ankle Dorsiflexion, Active Assisted  Remove shoes and sit on a chair that is preferably not on a carpeted surface.  Place right / left foot under knee. Extend your opposite leg for support.  Keeping your heel down, slide your right / left foot back toward the chair until you feel a stretch at your ankle or calf. If you do not feel a stretch, slide your bottom forward to the edge of the chair, while still keeping your heel down.  Hold this stretch for __________ seconds. Repeat __________ times. Complete this stretch __________ times per day.  STRETCH - Gastroc, Standing  Place hands on wall.  Extend right / left leg, keeping the front knee somewhat bent.  Slightly point your toes inward on your back foot.  Keeping your right / left heel on the floor and your knee straight, shift your weight toward the wall, not allowing your back to  arch.  You should feel a gentle stretch in the right / left calf. Hold this position for __________ seconds. Repeat __________ times. Complete this stretch __________ times per day. STRETCH - Soleus, Standing  Place hands on wall.  Extend right / left leg, keeping the other knee somewhat bent.  Slightly point your toes inward on your back foot.  Keep your right / left heel on the floor, bend your back knee, and slightly shift your weight over the back leg so that you feel a gentle stretch deep in your back calf.  Hold this position for __________ seconds. Repeat __________ times. Complete this stretch __________ times per day. STRETCH - Gastrocsoleus, Standing  Note: This exercise can place a lot of stress on your foot and ankle. Please complete this exercise only if specifically instructed by your caregiver.   Place the ball of your right / left foot on a step, keeping your other foot firmly on the same step.  Hold on to the wall or a rail for balance.  Slowly lift your other foot, allowing your body weight to press your heel down over the edge of the step.  You should feel a stretch in your right / left calf.  Hold this position for __________ seconds.  Repeat this exercise with a slight bend in your right / left knee. Repeat __________ times. Complete this stretch __________ times per day.    STRENGTHENING EXERCISES - Plantar Fasciitis (Heel Spur Syndrome)  These exercises may help you when beginning to rehabilitate your injury. They may resolve your symptoms with or without further involvement from your physician, physical therapist or athletic trainer. While completing these exercises, remember:   Muscles can gain both the endurance and the strength needed for everyday activities through controlled exercises.  Complete these exercises as instructed by your physician, physical therapist or athletic trainer. Progress the resistance and repetitions only as guided. STRENGTH -  Towel Curls  Sit in a chair positioned on a non-carpeted surface.  Place your foot on a towel, keeping your heel on the floor.  Pull the towel toward your heel by only curling your toes. Keep your heel on the floor.  If instructed by your physician, physical therapist or athletic trainer, add ____________________ at the end of the towel. Repeat __________ times. Complete this exercise __________ times per day. STRENGTH - Ankle Inversion  Secure one end of a rubber exercise band/tubing to a fixed object (table, pole). Loop the other end around your foot just before your toes.  Place your fists between your knees. This will focus your strengthening at your ankle.  Slowly, pull your big toe up and in, making sure the band/tubing is positioned to resist the entire motion.  Hold this position for __________ seconds.  Have your muscles resist the band/tubing as it slowly pulls your foot back to the starting position. Repeat __________ times. Complete this exercises __________ times per day.  Document Released: 01/31/2005 Document Revised: 04/25/2011 Document Reviewed: 05/15/2008 [employer] Patient Information 2015 [employer]. This information is not intended to replace advice given to you by your health care provider. Make sure you discuss any questions you have with your health care provider.  

## 2014-01-25 ENCOUNTER — Ambulatory Visit (INDEPENDENT_AMBULATORY_CARE_PROVIDER_SITE_OTHER): Payer: 59

## 2014-01-25 ENCOUNTER — Ambulatory Visit (INDEPENDENT_AMBULATORY_CARE_PROVIDER_SITE_OTHER): Payer: 59 | Admitting: Podiatry

## 2014-01-25 ENCOUNTER — Encounter: Payer: Self-pay | Admitting: Podiatry

## 2014-01-25 VITALS — BP 141/75 | HR 58 | Resp 16

## 2014-01-25 DIAGNOSIS — M7741 Metatarsalgia, right foot: Secondary | ICD-10-CM

## 2014-01-25 DIAGNOSIS — M722 Plantar fascial fibromatosis: Secondary | ICD-10-CM

## 2014-01-25 DIAGNOSIS — M7742 Metatarsalgia, left foot: Secondary | ICD-10-CM

## 2014-01-25 DIAGNOSIS — M8430XA Stress fracture, unspecified site, initial encounter for fracture: Secondary | ICD-10-CM

## 2014-01-27 NOTE — Progress Notes (Addendum)
Patient ID: Stephanie White, female   DOB: 08-23-1960, 53 y.o.   MRN: [id number]  Subjective:  53 year old female returns the office today for follow-up evaluation of bilateral heel pain, forefoot pain. She states that she has been icing the feet since last appointment and she's had some decrease in symptoms however she does continue to have some "discomfort but not pain like it was". No other complaints at this time. No acute changes since last appointment. Denies any systemic complaints such as fevers, chills, nausea, vomiting.  Objective: AAO x3, NAD DP/PT pulses palpable bilaterally, CRT less than 3 seconds Protective sensation intact with Simms Weinstein monofilament, vibratory sensation intact, Achilles tendon reflex intact There is no overlying edema, erythema, increased warmth to bilateral feet. There is mild tenderness to palpation over the plantar medial tubercle of the calcaneus at the insertion the plantar fascia bilaterally. There is no pain along the course of plantar fascial in the arch of the foot and plantar fascia appears to be intact. There is no pain with lateral compression of the calcaneus or pain with vibratory sensation bilaterally. There is no pain on the posterior aspect of the calcaneus or along the course/insertion of the Achilles tendon. There is mild discomfort on the plantar metatarsal heads bilaterally although this is decreased compared to prior. On the left foot there is slight discomfort overlying third metatarsal and mild discomfort with vibratory sensation. She states it is an achy feeling subjectively. MMT 5/5, ROM WNL No open lesions or pre-ulcerative lesions. No pain with calf compression, swelling, warmth, erythema.  Assessment: 53 year old female with bilateral plantar fasciitis, metatarsalgia, possible stress fracture left third metatarsal proximally.  Plan: -Treatment options were discussed including alternatives, risks, complications. -X-rays were  obtained and reviewed. On the left third metatarsal base there is a sclerotic line which is new compared to prior x-rays and there is mild discomfort over this area. She has recently started working more on the treadmill. Due to the discomfort over this area of possible changes on x-ray will immobilize and a surgical shoe at this time. Discussed the patient this may be a sign of early stress fracture. -Patient does not want any steroid injections. -Recommended OTC NSAIDs as the patient does not want prescription. Discussed side effects the medication and directed to stop immediately should any occur. -Continue stretching exercises. Recommended to hold off on the left side but can continue on the right.  -Ice to the area -Patient was inquiring about custom shoes (she likes the diabetic shoes). I discussed with her the shoes are likely not covered however she would like to try. A prescription was given to the patient to go to [facility] for evaluation.  -Follow-up in 2-3 weeks, or sooner should any problems arise, or any change in symptoms. In the meantime, call the office with any questions, concerns, change in symptoms.

## 2014-01-29 ENCOUNTER — Encounter: Payer: 59 | Admitting: Podiatry

## 2014-02-12 ENCOUNTER — Encounter: Payer: Self-pay | Admitting: Podiatry

## 2014-02-12 ENCOUNTER — Ambulatory Visit (INDEPENDENT_AMBULATORY_CARE_PROVIDER_SITE_OTHER): Payer: 59 | Admitting: Podiatry

## 2014-02-12 ENCOUNTER — Ambulatory Visit (INDEPENDENT_AMBULATORY_CARE_PROVIDER_SITE_OTHER): Payer: 59

## 2014-02-12 VITALS — BP 152/74 | HR 63 | Resp 18

## 2014-02-12 DIAGNOSIS — R52 Pain, unspecified: Secondary | ICD-10-CM

## 2014-02-12 DIAGNOSIS — M7742 Metatarsalgia, left foot: Secondary | ICD-10-CM

## 2014-02-12 DIAGNOSIS — M7741 Metatarsalgia, right foot: Secondary | ICD-10-CM

## 2014-02-12 DIAGNOSIS — M722 Plantar fascial fibromatosis: Secondary | ICD-10-CM

## 2014-02-14 HISTORY — PX: BREAST BIOPSY: SHX20

## 2014-02-14 NOTE — Progress Notes (Signed)
Patient ID: Stephanie White, female   DOB: [birth year], 54 y.o.   MRN: [id number]  Subjective: 54 year old female returns the office today for follow up evaluation of bilateral foot pain and for possible stress fracture left third metatarsal. The patient states that since last appointment she has been doing well her pain has significantly decreased in both of her feet. She continues to have some mild intermittent discomfort at times. Since last appointment she does state that she has purchased new shoes which has helped. She is also had custom orthotics made however she states that they had been adjusted and she is still not happy with them. She had the [facility] and she hasn't appointment to go back today for further modifications. She denies any recent injury or trauma to the area. She states that she has started to work out again. Denies any acute changes since last appointment and no other complaints at this time. Denies any systemic complaints such as fevers, chills, nausea, vomiting.  Objective: AAO x3, NAD DP/PT pulses palpable bilaterally, CRT less than 3 seconds Protective sensation intact with Simms Weinstein monofilament, vibratory sensation intact, Achilles tendon reflex intact There is no overlying edema, erythema or increase in warmth to bilateral lower extremities. There is no tenderness to palpation overlying the plantar medial tubercle of the calcaneus at the insertion of the plantar fascial bilaterally. There is no pain along the course of plantar fascial in the arch of the foot and it appears to be intact. There is no pain with lateral compression of the calcaneus or pain with vibratory sensation. No pain along the posterior aspect of the calcaneus or along the course last insertion of the Achilles tendon. There is no tenderness overlying the plantar forefoot overlying the metatarsal heads. Prominent metatarsal heads plantarly with mild atrophy of the fat pad. There is no areas of  pinpoint bony tenderness or pain with vibratory sensation to bilateral feet. No pain overlying the third metatarsal.  MMT 5/5, ROM WNL No open lesions or pre-ulcerative lesions. No pain with calf compression, swelling, warmth, erythema.  Assessment: 54 year old female with resolving bilateral heel pain, likely plantar fasciitis; metatarsalgia due to prominent metatarsal heads; will not stress fracture left third metatarsal  Plan : -X-rays were obtained and reviewed with the patient. There is no signs of fracture or stress fracture at this time. -Treatment options were discussed the patient include alternatives, risks, complication. -The patient's orthotics to appear to have a large metatarsal pad which is uncomfortable for the patient. She also states that in the heel area it is too hard. I recommended a metatarsal bar be shaved down as well as a cut out for the heel and a prescription was written for this and given to the patient she has an appointment to have them modified today [facility]. -Discussed the patient as her symptoms resolve she can gradually increase her exercise however would not recommend doing this suddenly. -Continue ice and elevation. -Patient wishes to hold off on any kind of medications. -Follow-up once the orthotics have been adjusted and she's had an appropriate break-in period. Recommend follow-up in the proximal before to 6 weeks or sooner should any palms arise. In the meantime, call the office in the questions, concerns, change in symptoms.

## 2014-05-26 ENCOUNTER — Other Ambulatory Visit: Payer: Self-pay

## 2014-05-26 DIAGNOSIS — Z1231 Encounter for screening mammogram for malignant neoplasm of breast: Secondary | ICD-10-CM

## 2014-05-28 ENCOUNTER — Ambulatory Visit
Admission: RE | Admit: 2014-05-28 | Discharge: 2014-05-28 | Disposition: A | Discharge status: Home / Self Care | Payer: 59 | Source: Ambulatory Visit

## 2014-05-28 DIAGNOSIS — Z1231 Encounter for screening mammogram for malignant neoplasm of breast: Secondary | ICD-10-CM

## 2014-05-30 ENCOUNTER — Other Ambulatory Visit: Payer: Self-pay | Admitting: Obstetrics and Gynecology

## 2014-05-30 DIAGNOSIS — R928 Other abnormal and inconclusive findings on diagnostic imaging of breast: Secondary | ICD-10-CM

## 2014-06-06 ENCOUNTER — Ambulatory Visit
Admission: RE | Admit: 2014-06-06 | Discharge: 2014-06-06 | Disposition: A | Discharge status: Home / Self Care | Payer: 59 | Source: Ambulatory Visit | Attending: Obstetrics and Gynecology | Admitting: Obstetrics and Gynecology

## 2014-06-06 ENCOUNTER — Other Ambulatory Visit: Payer: Self-pay | Admitting: Obstetrics and Gynecology

## 2014-06-06 DIAGNOSIS — N6320 Unspecified lump in the left breast, unspecified quadrant: Secondary | ICD-10-CM

## 2014-06-06 DIAGNOSIS — R599 Enlarged lymph nodes, unspecified: Secondary | ICD-10-CM

## 2014-06-06 DIAGNOSIS — R928 Other abnormal and inconclusive findings on diagnostic imaging of breast: Secondary | ICD-10-CM

## 2014-06-09 ENCOUNTER — Other Ambulatory Visit: Payer: Self-pay | Admitting: Obstetrics and Gynecology

## 2014-06-09 DIAGNOSIS — N6320 Unspecified lump in the left breast, unspecified quadrant: Secondary | ICD-10-CM

## 2014-06-11 ENCOUNTER — Ambulatory Visit
Admission: RE | Admit: 2014-06-11 | Discharge: 2014-06-11 | Disposition: A | Discharge status: Home / Self Care | Payer: 59 | Source: Ambulatory Visit | Attending: Obstetrics and Gynecology | Admitting: Obstetrics and Gynecology

## 2014-06-11 DIAGNOSIS — N6320 Unspecified lump in the left breast, unspecified quadrant: Secondary | ICD-10-CM

## 2014-06-11 DIAGNOSIS — R599 Enlarged lymph nodes, unspecified: Secondary | ICD-10-CM

## 2014-06-18 ENCOUNTER — Encounter: Payer: Self-pay | Admitting: Cardiology

## 2014-06-18 ENCOUNTER — Ambulatory Visit (INDEPENDENT_AMBULATORY_CARE_PROVIDER_SITE_OTHER): Payer: 59 | Admitting: Cardiology

## 2014-06-18 VITALS — BP 130/80 | HR 55 | Ht 63.0 in | Wt 169.0 lb

## 2014-06-18 DIAGNOSIS — I10 Essential (primary) hypertension: Secondary | ICD-10-CM

## 2014-06-18 NOTE — Progress Notes (Signed)
[facility] [address] [place], [place]  [address] Phone: [phone] Fax:  [phone]  Date:  06/18/2014   ID:  Stephanie White, DOB December 08, 1960, MRN [id number]  PCP:  Antony Blackbird, MD   History of Present Illness: Stephanie White is a 54 y.o. female here for  Follow-up of palpitations.  She had a sensation once again of anxiety attack after a breast biopsy.  Oral numbness. Had some left arm pain. Described as a fluttering sensation in her chest.  In review of Dr. Siri Cole note on 04/16/13 she was feeling palpitations as well as numbness in her upper arm and tingling that occurs sometimes at work, stressful even though she is sitting behind computer. When she has these episodes, sometimes she feels as though she is not breathing. She had episode of waking up feeling nauseous. She was concerned about possible heart attack after watching an episode of Dr. Irena Cords.   Potassium 3.4, creatinine 0.91, H. pylori negative, hemoglobin 12.1, TSH 1.8.   She had another experience while getting a breast biopsy where she had perioral tingling. Likely anxiety. This was told her. She also showed me some blood pressure readings that sometimes are in the 741-287 systolic but then decreased to the 130 range during the day.  Wt Readings from Last 3 Encounters:  06/18/14 169 lb (76.658 kg)  05/27/13 170 lb (77.111 kg)  01/31/13 163 lb 9.6 oz (74.208 kg)     Past Medical History  Diagnosis Date  . HTN (hypertension)   . Allergic rhinitis   . Hypokalemia   . Anemia   . Leukopenia 01/04/2012    Past Surgical History  Procedure Laterality Date  . Endometrial ablation  12/2009  . Cesarean section      x2  . Colonoscopy  2012    reportedly negative per patient    Current Outpatient Prescriptions  Medication Sig Dispense Refill  . EXFORGE 10-160 MG per tablet Take 1 tablet by mouth daily.    . Fish Oil OIL Take 1 capsule by mouth daily.     . hydrochlorothiazide (MICROZIDE) 12.5  MG capsule Take 12.5 mg by mouth daily.     . Multiple Vitamin (MULTIVITAMIN) tablet Take 1 tablet by mouth daily. ALIVE 50+ GUMMY MULTIVITAMIN     No current facility-administered medications for this visit.   FHX: Brother had CHF.  Allergies:    Allergies  Allergen Reactions  . Latex Rash    Social History:  The patient  reports that she has never smoked. She has never used smokeless tobacco. She reports that she does not drink alcohol or use illicit drugs.   ROS:  Please see the history of present illness.   Denies any fevers, chills, orthopnea, PND  . No chest pain, rashes, syncope, orthopnea. Unless explained above, all other review of systems negative.  PHYSICAL EXAM: VS:  BP 130/80 mmHg  Pulse 55  Ht 5\' 3"  (1.6 m)  Wt 169 lb (76.658 kg)  BMI 29.94 kg/m2 Well nourished, well developed, in no acute distress HEENT: normalNCAT Neck: no JVDNo bruits, carotid. Cardiac:  normal S1, S2; RRR; no murmur Lungs:  clear to auscultation bilaterally, no wheezing, rhonchi or rales Abd: soft, nontender, no hepatomegaly Ext: no edemaNormal pulse bilaterally Skin: warm and dry Neuro: no focal abnormalities noted, moves all extremities, normal gait GU: Deferred Rectal: Deferred  EKG:  05/27/13 -Sinus bradycardia rate 57 with nonspecific ST-T wave changes, subtle T-wave inversion in lead V2 and  flattening in the remainder of the precordial leads.     Lab work-reviewed as above. Prior medical records reviewed.  ASSESSMENT AND PLAN:  1. Palpitations-likely anxiety related. She describes the sensation of perioral numbness or paresthesias that often can accompany hyperventilation /anxiety. After discussing this with her, she does admit that she feels as though during these episodes that she is not breathing or breathing correctly. She will focus on deep breathing/relaxation patterns.. Her EKG showing subtle abnormalities as described above  No change from prior. She did not undergo exercise  treadmill test from last year area she exercises quite frequently without any anginal symptoms.  If chest pain returns or because more worrisome, I would once again advocate pursuing treadmill test as was prescribed last year. 2. Anxiety/stress - as above. Likely hyperventilation causing perioral numbness. 3. Abnormal ECG - as above.  4. Hypertension - well-controlled for the most part.  Continues to be elevated, consider addition of low-dose amlodipine 5 mg or perhaps low-dose ACE inhibitor. She is has discussed this now. She may follow-up with Dr. Chapman Fitch in the future. No further cardiac testing at this time. 5. PRN follow up.  Signed, Candee Furbish, MD [facility]  06/18/2014 1:51 PM

## 2014-06-18 NOTE — Patient Instructions (Signed)
Medication Instructions:  Your physician recommends that you continue on your current medications as directed. Please refer to the Current Medication list given to you today.  Labwork: None  Testing/Procedures: None  Follow-Up: Follow up as needed with Dr Marlou Porch.  Thank you for choosing [facility]!!

## 2014-10-06 ENCOUNTER — Encounter (HOSPITAL_COMMUNITY): Payer: Self-pay | Admitting: *Deleted

## 2014-10-06 ENCOUNTER — Emergency Department (HOSPITAL_COMMUNITY)
Admission: EM | Admit: 2014-10-06 | Discharge: 2014-10-06 | Disposition: A | Discharge status: Home / Self Care | Payer: 59 | Attending: Emergency Medicine | Admitting: Emergency Medicine

## 2014-10-06 ENCOUNTER — Emergency Department (HOSPITAL_COMMUNITY): Payer: 59

## 2014-10-06 DIAGNOSIS — R630 Anorexia: Secondary | ICD-10-CM | POA: Diagnosis not present

## 2014-10-06 DIAGNOSIS — Z79899 Other long term (current) drug therapy: Secondary | ICD-10-CM | POA: Insufficient documentation

## 2014-10-06 DIAGNOSIS — R2240 Localized swelling, mass and lump, unspecified lower limb: Secondary | ICD-10-CM | POA: Diagnosis not present

## 2014-10-06 DIAGNOSIS — R42 Dizziness and giddiness: Secondary | ICD-10-CM | POA: Diagnosis not present

## 2014-10-06 DIAGNOSIS — R002 Palpitations: Secondary | ICD-10-CM | POA: Insufficient documentation

## 2014-10-06 DIAGNOSIS — Z91040 Latex allergy status: Secondary | ICD-10-CM | POA: Insufficient documentation

## 2014-10-06 DIAGNOSIS — E876 Hypokalemia: Secondary | ICD-10-CM | POA: Diagnosis not present

## 2014-10-06 DIAGNOSIS — I10 Essential (primary) hypertension: Secondary | ICD-10-CM | POA: Insufficient documentation

## 2014-10-06 DIAGNOSIS — Z862 Personal history of diseases of the blood and blood-forming organs and certain disorders involving the immune mechanism: Secondary | ICD-10-CM | POA: Diagnosis not present

## 2014-10-06 DIAGNOSIS — R9431 Abnormal electrocardiogram [ECG] [EKG]: Secondary | ICD-10-CM | POA: Diagnosis present

## 2014-10-06 LAB — CBC
HEMATOCRIT: 37.7 % (ref 36.0–46.0)
Hemoglobin: 13 g/dL (ref 12.0–15.0)
MCH: 29 pg (ref 26.0–34.0)
MCHC: 34.5 g/dL (ref 30.0–36.0)
MCV: 84.2 fL (ref 78.0–100.0)
Platelets: 223 10*3/uL (ref 150–400)
RBC: 4.48 MIL/uL (ref 3.87–5.11)
RDW: 14.5 % (ref 11.5–15.5)
WBC: 4.4 10*3/uL (ref 4.0–10.5)

## 2014-10-06 LAB — BASIC METABOLIC PANEL
Anion gap: 11 (ref 5–15)
BUN: 9 mg/dL (ref 6–20)
CALCIUM: 10 mg/dL (ref 8.9–10.3)
CO2: 31 mmol/L (ref 22–32)
Chloride: 97 mmol/L — ABNORMAL LOW (ref 101–111)
Creatinine, Ser: 0.66 mg/dL (ref 0.44–1.00)
GFR calc Af Amer: >60 mL/min (ref >60)
GFR calc non Af Amer: >60 mL/min (ref >60)
GLUCOSE: 101 mg/dL — AB (ref 65–99)
Potassium: 2.9 mmol/L — ABNORMAL LOW (ref 3.5–5.1)
Sodium: 139 mmol/L (ref 135–145)

## 2014-10-06 LAB — I-STAT TROPONIN, ED: TROPONIN I, POC: 0 ng/mL (ref 0.00–0.08)

## 2014-10-06 MED ORDER — POTASSIUM CHLORIDE ER 10 MEQ PO TBCR: 20.0000 meq | EXTENDED_RELEASE_TABLET | Freq: Every day | ORAL | Status: DC

## 2014-10-06 MED ORDER — POTASSIUM CHLORIDE CRYS ER 20 MEQ PO TBCR
40.0000 meq | EXTENDED_RELEASE_TABLET | Freq: Once | ORAL | Status: AC
  Administered 2014-10-06: 40 meq via ORAL
  Filled 2014-10-06: qty 2

## 2014-10-06 NOTE — ED Provider Notes (Signed)
54 year old female, currently being treated for hypertension with hydrochlorothiazide since April, presents with an abnormal EKG area at this abnormal EKG consisted of frequent PVCs. On exam the patient has occasional ectopy, on the monitor she has approximately 10 PVCs per minute, [name] only. She has no shortness of breath, normal lung sounds, normal heart sounds, no edema, she appears very calm and rested otherwise. She will be given a copy of her results to go home, she can follow-up with her family doctor for a recheck of her potassium in 5 days.   EKG Interpretation  Date/Time:  Monday October 06 2014 22:02:56 EDT Ventricular Rate:  59 PR Interval:  182 QRS Duration: 93 QT Interval:  444 QTC Calculation: 440 R Axis:   62 Text Interpretation:  Sinus rhythm Ventricular premature complex Borderline T abnormalities, anterior leads ED PHYSICIAN INTERPRETATION AVAILABLE IN [facility] Confirmed by TEST, Record ([id number]) on 10/07/2014 6:59:29 AM        Medical screening examination/treatment/procedure(s) were conducted as a shared visit with non-physician practitioner(s) and myself.  I personally evaluated the patient during the encounter.  Clinical Impression:   Final diagnoses:  Palpitations  Hypokalemia         Noemi Chapel, MD 10/08/14 [phone]

## 2014-10-06 NOTE — ED Notes (Addendum)
Pt states that she has been having palpitations for 3 weeks. Pt was seen by her dr for this and found to have abnormal EKG. Pt was sent for evaluation. denies any associated symptoms.

## 2014-10-06 NOTE — Discharge Instructions (Signed)
1. Medications: potassium, usual home medications 2. Treatment: rest, drink plenty of fluids 3. Follow Up: please followup with your primary doctor in 5 days for discussion of your diagnoses and potassium recheck; please return to the ER for chest pain, shortness of breath, passing out, new or worsening symptoms   Palpitations A palpitation is the feeling that your heartbeat is irregular. It may feel like your heart is fluttering or skipping a beat. It may also feel like your heart is beating faster than normal. This is usually not a serious problem. In some cases, you may need more medical tests. HOME CARE  Avoid:  Caffeine in coffee, tea, soft drinks, diet pills, and energy drinks.  Chocolate.  Alcohol.  Stop smoking if you smoke.  Reduce your stress and anxiety. Try:  A method that measures bodily functions so you can learn to control them (biofeedback).  Yoga.  Meditation.  Physical activity such as swimming, jogging, or walking.  Get plenty of rest and sleep. GET HELP IF:  Your fast or irregular heartbeat continues after 24 hours.  Your palpitations occur more often. GET HELP RIGHT AWAY IF:   You have chest pain.  You feel short of breath.  You have a very bad headache.  You feel dizzy or pass out (faint). MAKE SURE YOU:   Understand these instructions.  Will watch your condition.  Will get help right away if you are not doing well or get worse. Document Released: 11/10/2007 Document Revised: 06/17/2013 Document Reviewed: 04/01/2011 [facility] Patient Information 2015 [place], [place]. This information is not intended to replace advice given to you by your health care provider. Make sure you discuss any questions you have with your health care provider.   Hypokalemia Hypokalemia means that the amount of potassium in the blood is lower than normal.Potassium is a chemical, called an electrolyte, that helps regulate the amount of fluid in the body. It also  stimulates muscle contraction and helps nerves function properly.Most of the body's potassium is inside of cells, and only a very small amount is in the blood. Because the amount in the blood is so small, minor changes can be life-threatening. CAUSES  Antibiotics.  Diarrhea or vomiting.  Using laxatives too much, which can cause diarrhea.  Chronic kidney disease.  Water pills (diuretics).  Eating disorders (bulimia).  Low magnesium level.  Sweating a lot. SIGNS AND SYMPTOMS  Weakness.  Constipation.  Fatigue.  Muscle cramps.  Mental confusion.  Skipped heartbeats or irregular heartbeat (palpitations).  Tingling or numbness. DIAGNOSIS  Your health care provider can diagnose hypokalemia with blood tests. In addition to checking your potassium level, your health care provider may also check other lab tests. TREATMENT Hypokalemia can be treated with potassium supplements taken by mouth or adjustments in your current medicines. If your potassium level is very low, you may need to get potassium through a vein (IV) and be monitored in the hospital. A diet high in potassium is also helpful. Foods high in potassium are:  Nuts, such as peanuts and pistachios.  Seeds, such as sunflower seeds and pumpkin seeds.  Peas, lentils, and lima beans.  Whole grain and bran cereals and breads.  Fresh fruit and vegetables, such as apricots, avocado, bananas, cantaloupe, kiwi, oranges, tomatoes, asparagus, and potatoes.  Orange and tomato juices.  Red meats.  Fruit yogurt. HOME CARE INSTRUCTIONS  Take all medicines as prescribed by your health care provider.  Maintain a healthy diet by including nutritious food, such as fruits, vegetables, nuts,  whole grains, and lean meats.  If you are taking a laxative, be sure to follow the directions on the label. SEEK MEDICAL CARE IF:  Your weakness gets worse.  You feel your heart pounding or racing.  You are vomiting or having  diarrhea.  You are diabetic and having trouble keeping your blood glucose in the normal range. SEEK IMMEDIATE MEDICAL CARE IF:  You have chest pain, shortness of breath, or dizziness.  You are vomiting or having diarrhea for more than 2 days.  You faint. MAKE SURE YOU:   Understand these instructions.  Will watch your condition.  Will get help right away if you are not doing well or get worse. Document Released: 01/31/2005 Document Revised: 11/21/2012 Document Reviewed: 08/03/2012 [facility] Patient Information 2015 [place], [place]. This information is not intended to replace advice given to you by your health care provider. Make sure you discuss any questions you have with your health care provider.

## 2014-10-06 NOTE — ED Notes (Signed)
PA at the bedside.

## 2014-10-06 NOTE — ED Provider Notes (Signed)
CSN: [id number]     Arrival date & time 10/06/14  1649 History   First MD Initiated Contact with Patient 10/06/14 2120     Chief Complaint  Patient presents with  . Abnormal ECG    HPI   Stephanie White is a 54 y.o. female with a PMH of HTN and hypokalemia who presents to the ED with palpitations. She was seen this afternoon in clinic by her physician, at which time she was noted to have an abnormal EKG and was sent to the ED. She reports she has experienced palpitations intermittently over the past year, but that her palpitations have increased in frequency and have occurred daily over the past 3 weeks. She states her palpitations seem to be precipitated by stress and anxiety. She reports relief of palpitations when she exercises. She reports lightheadedness. She denies chest pain, shortness of breath, dizziness, weakness, syncope, abdominal pain, nausea, vomiting, diarrhea.     Past Medical History  Diagnosis Date  . HTN (hypertension)   . Allergic rhinitis   . Hypokalemia   . Anemia   . Leukopenia 01/04/2012   Past Surgical History  Procedure Laterality Date  . Endometrial ablation  12/2009  . Cesarean section      x2  . Colonoscopy  2012    reportedly negative per patient   Family History  Problem Relation Age of Onset  . Hypertension Mother   . Diabetes Mother   . Heart disease Mother   . Cancer Father 39    prostate  . Heart disease Sister   . Hypertension Sister   . Heart disease Brother   . Cancer Paternal Uncle     colon  . Heart attack Neg Hx   . Stroke Neg Hx    Social History  Substance Use Topics  . Smoking status: Never Smoker   . Smokeless tobacco: Never Used  . Alcohol Use: No   OB History    No data available      Review of Systems  Constitutional: Positive for appetite change. Negative for fever, chills, diaphoresis, activity change and fatigue.       Reports decreased appetite x 3 weeks.  Respiratory: Negative for shortness of breath.    Cardiovascular: Positive for palpitations and leg swelling. Negative for chest pain.  Gastrointestinal: Negative for nausea, vomiting, abdominal pain, diarrhea, constipation and abdominal distention.  Genitourinary: Negative for dysuria, urgency and frequency.  Musculoskeletal: Negative for back pain, neck pain and neck stiffness.  Skin: Negative for color change, pallor, rash and wound.  Neurological: Positive for light-headedness. Negative for dizziness, syncope, weakness and numbness.  All other systems reviewed and are negative.    Allergies  Latex  Home Medications   Prior to Admission medications   Medication Sig Start Date End Date Taking? Authorizing Provider  COCONUT OIL PO Take 500 mg by mouth daily.    Historical Provider, MD  Digestive Enzymes (PAPAYA ENZYME PO) Take 2 tablets by mouth daily.    Historical Provider, MD  EXFORGE 10-160 MG per tablet Take 1 tablet by mouth daily. 12/23/12   Historical Provider, MD  Fish Oil OIL Take 1 capsule by mouth daily.     Historical Provider, MD  hydrochlorothiazide (MICROZIDE) 12.5 MG capsule Take 12.5 mg by mouth daily.  06/05/14   Historical Provider, MD  Magnesium 250 MG TABS Take 250 mg by mouth daily.    Historical Provider, MD  Multiple Vitamin (MULTIVITAMIN) tablet Take 1 tablet by mouth daily. ALIVE 50+  GUMMY MULTIVITAMIN    Historical Provider, MD  Probiotic Product (PROBIOTIC DAILY PO) Take 1 tablet by mouth daily.    Historical Provider, MD    BP 175/72 mmHg  Pulse 63  Temp(Src) 98.7 F (37.1 C) (Oral)  Resp 16  Ht 5\' 3"  (1.6 m)  Wt 165 lb (74.844 kg)  BMI 29.24 kg/m2  SpO2 98% Physical Exam  Constitutional: She is oriented to person, place, and time. She appears well-developed and well-nourished. No distress.  HENT:  Head: Normocephalic and atraumatic.  Right Ear: External ear normal.  Left Ear: External ear normal.  Nose: Nose normal.  Mouth/Throat: Oropharynx is clear and moist.  Eyes: Conjunctivae and EOM  are normal. Pupils are equal, round, and reactive to light. Right eye exhibits no discharge. Left eye exhibits no discharge. No scleral icterus.  Neck: Normal range of motion. Neck supple.  Cardiovascular: Normal rate, regular rhythm, normal heart sounds and intact distal pulses.   Regular rate and rhythm with occasional ectopy.  Pulmonary/Chest: Effort normal and breath sounds normal. No respiratory distress. She has no wheezes. She has no rales. She exhibits no tenderness.  Abdominal: Soft. Normal appearance and bowel sounds are normal. She exhibits no distension and no mass. There is no tenderness. There is no rebound and no guarding.  Musculoskeletal: Normal range of motion. She exhibits no edema or tenderness.  Neurological: She is alert and oriented to person, place, and time. She has normal strength. No sensory deficit.  Skin: Skin is warm and dry. No rash noted. She is not diaphoretic. No erythema. No pallor.  Psychiatric: She has a normal mood and affect. Her speech is normal and behavior is normal. Judgment and thought content normal.  Nursing note and vitals reviewed.   ED Course  Procedures (including critical care time)  Labs Review Labs Reviewed  BASIC METABOLIC PANEL - Abnormal; Notable for the following:    Potassium 2.9 (*)    Chloride 97 (*)    Glucose, Bld 101 (*)    All other components within normal limits  CBC  I-STAT TROPOININ, ED    Imaging Review Dg Chest 2 View  10/06/2014   CLINICAL DATA:  Chest pain for 3 weeks.  EXAM: CHEST  2 VIEW  COMPARISON:  None.  FINDINGS: The heart size and mediastinal contours are within normal limits. Both lungs are clear. No pneumothorax or pleural effusion is noted. The visualized skeletal structures are unremarkable.  IMPRESSION: No active cardiopulmonary disease.   Electronically Signed   By: Marijo Conception, M.D.   On: 10/06/2014 17:25   I have personally reviewed and evaluated these images and lab results as part of my  medical decision-making.   EKG Interpretation   Date/Time:  Monday October 06 2014 16:57:04 EDT Ventricular Rate:  61 PR Interval:  172 QRS Duration: 92 QT Interval:  430 QTC Calculation: 432 R Axis:   68 Text Interpretation:  Normal sinus rhythm Normal ECG No old tracing to  compare Confirmed by MILLER  MD, [place] ([id number]) on 10/06/2014 9:25:38 PM      MDM   Final diagnoses:  Palpitations  Hypokalemia   54 year old female presents with palpitations, noted to have an abnormal EKG in her physician's office this afternoon. EKG in the ED demonstrates occasional PVCs. Denies chest pain, shortness of breath, abdominal pain, nausea, vomiting, syncope, weakness. Vital signs are stable. Physical exam remarkable for regular rate and rhythm with occasional ectopy, lungs clear to auscultation, abdomen non tender  to palpation, no lower extremity edema. Patient was seen by cardiology 06/18/14 for the same symptoms, and at that time was thought to be related to anxiety. No acute cardiopulmonary disease on CXR. EKG no evidence of acute ischemia. Troponin negative. No evidence of ACS. CBC within normal limits.  Patient on HCTZ for HTN. BMP demonstrates potassium 2.9, repleted in the ED. Most likely related to HCTZ use. Will discharge with potassium supplementation x 5 days. Return precautions given. Follow-up with PCP in 5 days for recheck of potassium.   BP 143/68 mmHg  Pulse 58  Temp(Src) 98.7 F (37.1 C) (Oral)  Resp 16  Ht 5\' 3"  (1.6 m)  Wt 165 lb (74.844 kg)  BMI 29.24 kg/m2  SpO2 98%      Marella Chimes, PA-C 10/07/14 0058  Noemi Chapel, MD 10/08/14 [phone]

## 2014-11-10 ENCOUNTER — Other Ambulatory Visit: Payer: Self-pay | Admitting: Obstetrics and Gynecology

## 2014-11-10 DIAGNOSIS — N63 Unspecified lump in unspecified breast: Secondary | ICD-10-CM

## 2014-11-26 ENCOUNTER — Encounter: Payer: Self-pay | Admitting: Cardiology

## 2014-12-03 ENCOUNTER — Telehealth: Payer: Self-pay | Admitting: Cardiology

## 2014-12-03 NOTE — Telephone Encounter (Signed)
Left message to call back  

## 2014-12-03 NOTE — Telephone Encounter (Signed)
New message      Question about her potassium being low.  Patient's PCP is taking her off her potassium pill because of side effects.  However, her potassium was low on last lab test.  Pt is concerned.  Should she take a potassium supplement?  Our nurse called her recently to talk about the lab results

## 2014-12-03 NOTE — Telephone Encounter (Signed)
Pt states that she was recently taken off of her K+ supplement due to itching and weakness in legs. Pt was instructed by PCP to increase dietary K+.  Pt states that she does eat K+ rich foods on a regular basis. Pt states that she use to take an OTC 99mg  K+ supplement prior to the lab work that showed her K+ was low. Pt wants to know if Dr. Marlou Porch would recommend any kind of OTC/natural supplement that she would benefit from taking to keep her K+ up. Pt states that while taking the K+ 15mEq her palpitations improved tremendously and she doesn't want a reoccurrence. Advised pt that I would route this information to Dr. Marlou Porch for review and advisement. Pt verbalized understanding and was very appreciative for return call.

## 2014-12-06 NOTE — Telephone Encounter (Signed)
One potential suggestion is to stop the HCTZ 12.5 which may be contributing to the hypokalemia.   If BP increases off of this medication, we could try low dose spironolactone, a potassium sparing diuretic, 12.5mg  QD which will not lower potassium.  Have her set up an appt. To discuss.   Candee Furbish, MD

## 2014-12-10 NOTE — Telephone Encounter (Signed)
Spoke with pt who reports she has not been on HCTZ for about 2 months and was started on Amlodipine by Dr Chapman Fitch.  She is having repeat lab on Monday to f/u with her K+  Level.  She has been taking OTC potassium but not the RX KCL supplement she was prescribed d/t itching.  She denies any leg weakness or itching at this time.  She will c/b after receiving the results of her blood work on Monday if she has further concerns or questions.

## 2014-12-12 ENCOUNTER — Ambulatory Visit
Admission: RE | Admit: 2014-12-12 | Discharge: 2014-12-12 | Disposition: A | Discharge status: Home / Self Care | Payer: 59 | Source: Ambulatory Visit | Attending: Obstetrics and Gynecology | Admitting: Obstetrics and Gynecology

## 2014-12-12 DIAGNOSIS — N63 Unspecified lump in unspecified breast: Secondary | ICD-10-CM

## 2014-12-14 ENCOUNTER — Emergency Department (HOSPITAL_COMMUNITY)
Admission: EM | Admit: 2014-12-14 | Discharge: 2014-12-14 | Disposition: A | Discharge status: Home / Self Care | Payer: 59 | Attending: Emergency Medicine | Admitting: Emergency Medicine

## 2014-12-14 ENCOUNTER — Emergency Department (HOSPITAL_COMMUNITY): Payer: 59

## 2014-12-14 ENCOUNTER — Encounter (HOSPITAL_COMMUNITY): Payer: Self-pay | Admitting: Emergency Medicine

## 2014-12-14 DIAGNOSIS — Y9289 Other specified places as the place of occurrence of the external cause: Secondary | ICD-10-CM | POA: Diagnosis not present

## 2014-12-14 DIAGNOSIS — Y9389 Activity, other specified: Secondary | ICD-10-CM | POA: Diagnosis not present

## 2014-12-14 DIAGNOSIS — I10 Essential (primary) hypertension: Secondary | ICD-10-CM | POA: Insufficient documentation

## 2014-12-14 DIAGNOSIS — Y998 Other external cause status: Secondary | ICD-10-CM | POA: Diagnosis not present

## 2014-12-14 DIAGNOSIS — W1839XA Other fall on same level, initial encounter: Secondary | ICD-10-CM | POA: Insufficient documentation

## 2014-12-14 DIAGNOSIS — Z91040 Latex allergy status: Secondary | ICD-10-CM | POA: Insufficient documentation

## 2014-12-14 DIAGNOSIS — Z79899 Other long term (current) drug therapy: Secondary | ICD-10-CM | POA: Diagnosis not present

## 2014-12-14 DIAGNOSIS — S93402A Sprain of unspecified ligament of left ankle, initial encounter: Secondary | ICD-10-CM

## 2014-12-14 DIAGNOSIS — E876 Hypokalemia: Secondary | ICD-10-CM | POA: Insufficient documentation

## 2014-12-14 DIAGNOSIS — S99922A Unspecified injury of left foot, initial encounter: Secondary | ICD-10-CM | POA: Diagnosis present

## 2014-12-14 MED ORDER — NAPROXEN 500 MG PO TABS: 500.0000 mg | ORAL_TABLET | Freq: Two times a day (BID) | ORAL | Status: DC

## 2014-12-14 NOTE — ED Provider Notes (Signed)
CSN: [id number]     Arrival date & time 12/14/14  0503 History   First MD Initiated Contact with Patient 12/14/14 [phone]     Chief Complaint  Patient presents with  . Foot Injury     (Consider location/radiation/quality/duration/timing/severity/associated sxs/prior Treatment) Patient is a 54 y.o. female presenting with ankle pain. The history is provided by the patient.  Ankle Pain Location:  Ankle Time since incident:  1 day Injury: yes   Mechanism of injury: fall   Ankle location:  L ankle Pain details:    Quality:  Aching   Radiates to:  Does not radiate   Severity:  Moderate   Onset quality:  Sudden   Timing:  Constant   Progression:  Worsening Chronicity:  New Dislocation: no   Foreign body present:  No foreign bodies Prior injury to area:  No Relieved by:  Nothing Worsened by:  Nothing tried Ineffective treatments:  Ice and elevation Associated symptoms: swelling (mild)   Associated symptoms: no decreased ROM, no muscle weakness and no numbness     Past Medical History  Diagnosis Date  . HTN (hypertension)   . Allergic rhinitis   . Hypokalemia   . Anemia   . Leukopenia 01/04/2012   Past Surgical History  Procedure Laterality Date  . Endometrial ablation  12/2009  . Cesarean section      x2  . Colonoscopy  2012    reportedly negative per patient   Family History  Problem Relation Age of Onset  . Hypertension Mother   . Diabetes Mother   . Heart disease Mother   . Cancer Father 12    prostate  . Heart disease Sister   . Hypertension Sister   . Heart disease Brother   . Cancer Paternal Uncle     colon  . Heart attack Neg Hx   . Stroke Neg Hx    Social History  Substance Use Topics  . Smoking status: Never Smoker   . Smokeless tobacco: Never Used  . Alcohol Use: No   OB History    No data available     Review of Systems  All other systems reviewed and are negative.     Allergies  Latex and Tramadol  Home Medications   Prior to  Admission medications   Medication Sig Start Date End Date Taking? Authorizing Provider  calcium-vitamin D (OSCAL WITH D) 500-200 MG-UNIT tablet Take 1 tablet by mouth daily with breakfast.   Yes Historical Provider, MD  Digestive Enzymes (PAPAYA ENZYME PO) Take 2 tablets by mouth daily.   Yes Historical Provider, MD  EXFORGE 10-160 MG per tablet Take 1 tablet by mouth daily. 12/23/12  Yes Historical Provider, MD  Fish Oil OIL Take 1 capsule by mouth daily.    Yes Historical Provider, MD  Magnesium 250 MG TABS Take 250 mg by mouth daily.   Yes Historical Provider, MD  Multiple Vitamin (MULTIVITAMIN) tablet Take 1 tablet by mouth daily. ALIVE 50+ GUMMY MULTIVITAMIN   Yes Historical Provider, MD  Potassium 99 MG TABS Take 1 tablet by mouth daily.   Yes Historical Provider, MD  Probiotic Product (PROBIOTIC DAILY PO) Take 1 tablet by mouth daily.   Yes Historical Provider, MD  potassium chloride (K-DUR) 10 MEQ tablet Take 2 tablets (20 mEq total) by mouth daily. Patient not taking: Reported on 12/14/2014 10/06/14   Marella Chimes, PA-C   BP 150/75 mmHg  Pulse 63  Temp(Src) 97.7 F (36.5 C) (Oral)  Resp 16  Ht 5\' 3"  (1.6 m)  Wt 165 lb (74.844 kg)  BMI 29.24 kg/m2  SpO2 99% Physical Exam  Constitutional: She is oriented to person, place, and time. She appears well-developed and well-nourished. No distress.  HENT:  Head: Normocephalic.  Eyes: Conjunctivae are normal.  Neck: Neck supple. No tracheal deviation present.  Cardiovascular: Normal rate and regular rhythm.   Pulmonary/Chest: Effort normal. No respiratory distress.  Abdominal: Soft. She exhibits no distension.  Musculoskeletal:       Left ankle: She exhibits swelling (minimal). She exhibits normal range of motion, no ecchymosis and no deformity. Tenderness. AITFL tenderness found. No lateral malleolus and no medial malleolus tenderness found. Achilles tendon normal.  Able to bear weight appropriately  Neurological: She is alert  and oriented to person, place, and time.  Skin: Skin is warm and dry.  Psychiatric: She has a normal mood and affect.    ED Course  Procedures (including critical care time) Labs Review Labs Reviewed - No data to display  Imaging Review  Dg Foot Complete Left  12/14/2014  CLINICAL DATA:  Medial and lateral malleolus pain for 1 day after missed step, rolling foot. EXAM: LEFT FOOT - COMPLETE 3+ VIEW COMPARISON:  LEFT foot radiograph February 12, 2014 FINDINGS: New small bony fragment projecting lateral to the calcaneus anterior process on the frontal radiograph no dislocation. No destructive bony lesions. Lateral mid and-soft tissue swelling without subcutaneous gas or radiopaque foreign bodies. IMPRESSION: Small avulsion fracture, likely arising from anterior calcaneus, which could be confirmed on cross-sectional imaging as clinically indicated. No dislocation. Electronically Signed   By: Elon Alas M.D.   On: 12/14/2014 06:03   I have personally reviewed and evaluated these images and lab results as part of my medical decision-making.   EKG Interpretation None      MDM   Final diagnoses:  Left ankle sprain, initial encounter    54 y.o. female presents with ankle pain after missing a step and sustaining an inversion injury. Plain film of foot with possible avulsion fracture but not c/w pain distribution. Tenderness is evident over ligaments, suspect sprain clinically. Pt given instructions for supportive care including NSAIDs, rest, ice, compression, and elevation to help alleviate symptoms. Plan to follow up with PCP as needed and return precautions discussed for worsening or new concerning symptoms.     Leo Grosser, MD 12/14/14 [phone]

## 2014-12-14 NOTE — ED Notes (Signed)
Pt states she missed bottle step at home yesterday, injuring L foot, slight swelling noted, tender to touch +cms

## 2014-12-14 NOTE — Discharge Instructions (Signed)
Ankle Sprain °An ankle sprain is an injury to the strong, fibrous tissues (ligaments) that hold the bones of your ankle joint together.  °CAUSES °An ankle sprain is usually caused by a fall or by twisting your ankle. Ankle sprains most commonly occur when you step on the outer edge of your foot, and your ankle turns inward. People who participate in sports are more prone to these types of injuries.  °SYMPTOMS  °· Pain in your ankle. The pain may be present at rest or only when you are trying to stand or walk. °· Swelling. °· Bruising. Bruising may develop immediately or within 1 to 2 days after your injury. °· Difficulty standing or walking, particularly when turning corners or changing directions. °DIAGNOSIS  °Your caregiver will ask you details about your injury and perform a physical exam of your ankle to determine if you have an ankle sprain. During the physical exam, your caregiver will press on and apply pressure to specific areas of your foot and ankle. Your caregiver will try to move your ankle in certain ways. An X-ray exam may be done to be sure a bone was not broken or a ligament did not separate from one of the bones in your ankle (avulsion fracture).  °TREATMENT  °Certain types of braces can help stabilize your ankle. Your caregiver can make a recommendation for this. Your caregiver may recommend the use of medicine for pain. If your sprain is severe, your caregiver may refer you to a surgeon who helps to restore function to parts of your skeletal system (orthopedist) or a physical therapist. °HOME CARE INSTRUCTIONS  °· Apply ice to your injury for 1-2 days or as directed by your caregiver. Applying ice helps to reduce inflammation and pain. °· Put ice in a plastic bag. °· Place a towel between your skin and the bag. °· Leave the ice on for 15-20 minutes at a time, every 2 hours while you are awake. °· Only take over-the-counter or prescription medicines for pain, discomfort, or fever as directed by  your caregiver. °· Elevate your injured ankle above the level of your heart as much as possible for 2-3 days. °· If your caregiver recommends crutches, use them as instructed. Gradually put weight on the affected ankle. Continue to use crutches or a cane until you can walk without feeling pain in your ankle. °· If you have a plaster splint, wear the splint as directed by your caregiver. Do not rest it on anything harder than a pillow for the first 24 hours. Do not put weight on it. Do not get it wet. You may take it off to take a shower or bath. °· You may have been given an elastic bandage to wear around your ankle to provide support. If the elastic bandage is too tight (you have numbness or tingling in your foot or your foot becomes cold and blue), adjust the bandage to make it comfortable. °· If you have an air splint, you may blow more air into it or let air out to make it more comfortable. You may take your splint off at night and before taking a shower or bath. Wiggle your toes in the splint several times per day to decrease swelling. °SEEK MEDICAL CARE IF:  °· You have rapidly increasing bruising or swelling. °· Your toes feel extremely cold or you lose feeling in your foot. °· Your pain is not relieved with medicine. °SEEK IMMEDIATE MEDICAL CARE IF: °· Your toes are numb or blue. °·   You have severe pain that is increasing. MAKE SURE YOU:   Understand these instructions.  Will watch your condition.  Will get help right away if you are not doing well or get worse.   This information is not intended to replace advice given to you by your health care provider. Make sure you discuss any questions you have with your health care provider.   Document Released: 01/31/2005 Document Revised: 02/21/2014 Document Reviewed: 02/12/2011 Elsevier Interactive Patient Education 2016 [place] for Routine Care of Injuries Theroutine careofmanyinjuriesincludes rest, ice, compression, and elevation  (RICE therapy). RICE therapy is often recommended for injuries to soft tissues, such as a muscle strain, ligament injuries, bruises, and overuse injuries. It can also be used for some bony injuries. Using RICE therapy can help to relieve pain, lessen swelling, and enable your body to heal. Rest Rest is required to allow your body to heal. This usually involves reducing your normal activities and avoiding use of the injured part of your body. Generally, you can return to your normal activities when you are comfortable and have been given permission by your health care provider. Ice Icing your injury helps to keep the swelling down, and it lessens pain. Do not apply ice directly to your skin.  Put ice in a plastic bag.  Place a towel between your skin and the bag.  Leave the ice on for 20 minutes, 2-3 times a day. Do this for as long as you are directed by your health care provider. Compression Compression means putting pressure on the injured area. Compression helps to keep swelling down, gives support, and helps with discomfort. Compression may be done with an elastic bandage. If an elastic bandage has been applied, follow these general tips:  Remove and reapply the bandage every 3-4 hours or as directed by your health care provider.  Make sure the bandage is not wrapped too tightly, because this can cut off circulation. If part of your body beyond the bandage becomes blue, numb, cold, swollen, or more painful, your bandage is most likely too tight. If this occurs, remove your bandage and reapply it more loosely.  See your health care provider if the bandage seems to be making your problems worse rather than better. Elevation Elevation means keeping the injured area raised. This helps to lessen swelling and decrease pain. If possible, your injured area should be elevated at or above the level of your heart or the center of your chest. [place]? You should seek medical  care if:  Your pain and swelling continue.  Your symptoms are getting worse rather than improving. These symptoms may indicate that further evaluation or further X-rays are needed. Sometimes, X-rays may not show a small broken bone (fracture) until a number of days later. Make a follow-up appointment with your health care provider. WHEN SHOULD I SEEK IMMEDIATE MEDICAL CARE? You should seek immediate medical care if:  You have sudden severe pain at or below the area of your injury.  You have redness or increased swelling around your injury.  You have tingling or numbness at or below the area of your injury that does not improve after you remove the elastic bandage.   This information is not intended to replace advice given to you by your health care provider. Make sure you discuss any questions you have with your health care provider.   Document Released: 05/15/2000 Document Revised: 10/22/2014 Document Reviewed: 01/08/2014 Elsevier Interactive Patient Education [employer].

## 2015-01-23 ENCOUNTER — Ambulatory Visit: Payer: 59

## 2015-01-23 ENCOUNTER — Encounter: Payer: Self-pay | Admitting: Podiatry

## 2015-01-23 ENCOUNTER — Ambulatory Visit (INDEPENDENT_AMBULATORY_CARE_PROVIDER_SITE_OTHER): Payer: 59 | Admitting: Podiatry

## 2015-01-23 VITALS — BP 124/66 | HR 68 | Resp 18

## 2015-01-23 DIAGNOSIS — S93402D Sprain of unspecified ligament of left ankle, subsequent encounter: Secondary | ICD-10-CM | POA: Diagnosis not present

## 2015-01-23 NOTE — Patient Instructions (Addendum)

## 2015-01-29 DIAGNOSIS — S93409A Sprain of unspecified ligament of unspecified ankle, initial encounter: Secondary | ICD-10-CM | POA: Insufficient documentation

## 2015-01-29 NOTE — Progress Notes (Signed)
Patient ID: Stephanie White, female   DOB: 03/15/1960, 54 y.o.   MRN: [id number]  Subjective: 54 year old female presents the office today for follow-up evaluation after she sustained a ankle sprain to the left side at the end of October. She states that since that she has continued have some swelling and pain to the outside portion of her left ankle. She states that she missed a step twisting her foot and she describes inversion type injury. At that time she did go to the emergency room and x-rays were taken and she was told there is no fracture. She has not been wearing a brace or any rehabilitation exercises. No other complaints.  Objective: AAO 3, NAD DP/PT pulses 2/4, CRT less than 3 seconds Protective sensation intact with Simms Weinstein monofilament There is tenderness palpation on the course the ATFL and CFL and the left ankle. There is no pain on the course of the PTFL. No films syndesmosis, medial ankle ligaments. There is no pinpoint area tenderness on the tibia, fibula or other areas of the foot. Ankle, subtalar, midtarsal range of motion intact. Anterior drawer slightly increased There is guarding due to pain. There is mild edema along the lateral aspect of the left ankle without any assistive erythema or increase in warmth. No other areas of tenderness bilateral lower extremities. No open lesions or pre-ulcerative lesions. No pain with calf compression, swelling, warmth, erythema.  Assessment:  54 year old female left ankle sprain  Plan: -Treatment options discussed including all alternatives, risks, and complications -X-rays were obtained and reviewed with the patient.  -At this time recommended immobilization in a ankle brace until the pain and swelling resolves. Also start rehabilitation exercises. Ice and elevation. Anti-inflammatories. If symptoms persist the next appointment will likely for physical therapy or possible order an MRI. -Follow-up as scheduled or sooner if any  problems arise. In the meantime, encouraged to call the office with any questions, concerns, change in symptoms.   Celesta Gentile, DPM

## 2015-01-30 ENCOUNTER — Other Ambulatory Visit: Payer: Self-pay | Admitting: Gastroenterology

## 2015-02-23 ENCOUNTER — Ambulatory Visit: Payer: 59

## 2015-02-23 ENCOUNTER — Ambulatory Visit (INDEPENDENT_AMBULATORY_CARE_PROVIDER_SITE_OTHER): Payer: 59 | Admitting: Podiatry

## 2015-02-23 ENCOUNTER — Ambulatory Visit: Payer: 59 | Admitting: Podiatry

## 2015-02-23 DIAGNOSIS — R52 Pain, unspecified: Secondary | ICD-10-CM | POA: Diagnosis not present

## 2015-02-23 DIAGNOSIS — S93402D Sprain of unspecified ligament of left ankle, subsequent encounter: Secondary | ICD-10-CM | POA: Diagnosis not present

## 2015-02-23 DIAGNOSIS — M722 Plantar fascial fibromatosis: Secondary | ICD-10-CM | POA: Diagnosis not present

## 2015-02-24 NOTE — Progress Notes (Signed)
Patient ID: Stephanie White, female   DOB: November 24, 1960, 55 y.o.   MRN: [id number]  Subjective: 55 year old female presents the office today for evaluation of left ankle sprain. She states that her pain has resolved and she is not having any pain at this time although when she does come out of the brace she does have some swelling. She is able to wear regular shoe without any difficulty. She says her plantar fasciitis accept intermittently and is inquiring about a possible brace or stalk to wear around the house which will help. This has been ongoing issue for quite some time and comes and goes. She's going on having any heel pain. Denies any systemic complaints such as fevers, chills, nausea, vomiting. No acute changes since last appointment, and no other complaints at this time.   Objective: AAO x3, NAD DP/PT pulses palpable bilaterally, CRT less than 3 seconds Protective sensation intact with Simms Weinstein monofilament At this time there is no tenderness to palpation along the course the lateral ankle ligaments of the left side along the syndesmosis or medial ankle ligaments. There is no pain with ankle joint range of motion. Anterior drawer and talar tilt test appears to be negative. There is no tenderness to palpation on the course of plantar fascial bilaterally and the plantar fascia appears to be intact. There is no area pinpoint bony tenderness or pain the vibratory sensation of bilateral lower extremities. MMT 5/5, ROM WNL. No edema, erythema, increase in warmth to bilateral lower extremities.  No open lesions or pre-ulcerative lesions.  No pain with calf compression, swelling, warmth, erythema  Assessment: 55 year old female with resolving left ankle sprain, plantar fasciitis  Plan: -All treatment options discussed with the patient including all alternatives, risks, complications.  -At this time continue rehabilitation exercises for ankle sprain for which she states that she has been  doing. Continue to wean off the ankle brace and not wearing as much as tolerated. If she is having difficulty coming with an ankle brace will likely refer to physical therapy. -Discussed continue with stretching exercises for plantar fasciitis as well as icing and supportive shoes and orthotics. She did purchase a pad for the heel to wear around the house although I still recommend supportive shoes. -Follow-up in 4 weeks if symptoms continue or sooner if any problems arise. In the meantime, encouraged to call the office with any questions, concerns, change in symptoms.   Celesta Gentile, DPM

## 2015-05-06 ENCOUNTER — Other Ambulatory Visit: Payer: Self-pay

## 2015-05-06 DIAGNOSIS — Z1231 Encounter for screening mammogram for malignant neoplasm of breast: Secondary | ICD-10-CM

## 2015-05-28 ENCOUNTER — Telehealth: Payer: Self-pay | Admitting: Cardiology

## 2015-05-29 ENCOUNTER — Telehealth: Payer: Self-pay | Admitting: Cardiovascular Disease

## 2015-05-29 NOTE — Telephone Encounter (Signed)
Close encounter 

## 2015-05-29 NOTE — Telephone Encounter (Signed)
Received records from [facility] for appointment on 06/23/15 with Dr Oval Linsey.  Records given to [facility] (medical records) for Dr Blenda Mounts schedule on 06/23/15. lp

## 2015-06-01 ENCOUNTER — Ambulatory Visit
Admission: RE | Admit: 2015-06-01 | Discharge: 2015-06-01 | Disposition: A | Discharge status: Home / Self Care | Payer: 59 | Source: Ambulatory Visit

## 2015-06-01 DIAGNOSIS — Z1231 Encounter for screening mammogram for malignant neoplasm of breast: Secondary | ICD-10-CM

## 2015-06-23 ENCOUNTER — Encounter: Payer: Self-pay | Admitting: Cardiovascular Disease

## 2015-06-23 ENCOUNTER — Ambulatory Visit (INDEPENDENT_AMBULATORY_CARE_PROVIDER_SITE_OTHER): Payer: 59 | Admitting: Cardiovascular Disease

## 2015-06-23 VITALS — BP 140/84 | HR 62 | Ht 63.0 in | Wt 175.4 lb

## 2015-06-23 DIAGNOSIS — I10 Essential (primary) hypertension: Secondary | ICD-10-CM | POA: Diagnosis not present

## 2015-06-23 DIAGNOSIS — I493 Ventricular premature depolarization: Secondary | ICD-10-CM | POA: Diagnosis not present

## 2015-06-23 DIAGNOSIS — E876 Hypokalemia: Secondary | ICD-10-CM

## 2015-06-23 DIAGNOSIS — R0789 Other chest pain: Secondary | ICD-10-CM

## 2015-06-23 HISTORY — DX: Ventricular premature depolarization: I49.3

## 2015-06-23 MED ORDER — SPIRONOLACTONE 25 MG PO TABS: ORAL_TABLET | ORAL | Status: DC

## 2015-06-23 NOTE — Patient Instructions (Addendum)
Medication Instructions:  START SPIRONOLACTONE 25 MG 1/2 TABLET DAILY START ON Monday AFTER YOUR LAB WORK  Labwork: BMET/MAGNESIUM/RENIN ALDOSTERONE LEVEL AT [facility] ON THE FIRST FLOOR  Testing/Procedures: Your physician has requested that you have an exercise tolerance test. For further information please visit [email or web address]. Please also follow instruction sheet, as given.  Follow-Up: Your physician recommends that you schedule a follow-up appointment in: 2 WEEKS AFTER ETT  If you need a refill on your cardiac medications before your next appointment, please call your pharmacy.

## 2015-06-23 NOTE — Progress Notes (Signed)
Cardiology Office Note   Date:  06/23/2015   ID:  Stephanie Stephanie [name], DOB 11/27/1960, MRN [id number]  PCP:  Antony Blackbird, MD  Cardiologist:   Skeet Latch, MD   Chief Complaint  Patient presents with  . New Evaluation    Referred by Antony Blackbird, MD for Stephanie [name]  pt states she is here because she is still feeling Stephanie [name]--has been for 2 years; ED in August for severe episode, Stephanie [name]/hypokalemia; wants to know if anything else going on other than hypokalemia      History of Present Illness: Stephanie Stephanie [name] is a 55 y.o. female with hypertension, hypokalemia, and hyperlipidemia who presents for An evaluation of Stephanie [name].  Stephanie Stephanie [name], Dr. Antony Blackbird, on 05/27/15. At that appointment she reported Stephanie [name].  She related it to the valsartan and she was taking. She also attributed weight gain, increased thirst, nausea, tiredness, weight gain, headaches and tingling in her hands and feet to the medication.  She had a basic metabolic panel that revealed a potassium of 3.3 and normal creatinine. Thyroid function was within normal limits.  Stephanie Stephanie [name] for the last two years.  Last August she had a severe episode of Stephanie [name] and had frequent PVCs on her EKG.  She was referred to the ED and noted to have a potassium of 2.9. She was repelted with potassium and the PVCs subsided.  Since then she has been on some potassium supplementation. She was on hydrochlorothiazide that has also been discontinued. It is worse when she lays down at night, with stress, or when she exerts herself. She denies dizziness but notes mild lightheadedness. She sometimes notes tingling in her arms.  She eats a healthy diet and exercises at least 4 days per week.  She doesn't have chest pain or Stephanie [name] with exercise but it is worse when she rests.  She denies shortness of breath.  She notes mild lower extremity edema, orthopnea or PND.    Her mother  had a heart attack and diet at age 41.  Her brother has CHF.  Of note, Stephanie Stephanie [name] doeesn't take her antihypertensives on the weekend.  She thinks that her body needs a rest from these medicines  Past Medical History  Diagnosis Date  . HTN (hypertension)   . Allergic rhinitis   . Hypokalemia   . Anemia   . Leukopenia 01/04/2012  . PVC (premature ventricular contraction) 06/23/2015    Past Surgical History  Procedure Laterality Date  . Endometrial ablation  12/2009  . Cesarean section      x2  . Colonoscopy  2012    reportedly negative per patient     Current Outpatient Prescriptions  Medication Sig Dispense Refill  . Bacillus Coagulans-Inulin (PROBIOTIC FORMULA) 1-250 BILLION-MG CAPS Take 1 tablet by mouth daily.    . calcium-vitamin D (OSCAL WITH D) 500-200 MG-UNIT tablet Take 1 tablet by mouth daily with breakfast.    . EXFORGE 10-160 MG per tablet Take 1 tablet by mouth daily.    . Fish Oil OIL Take 3 capsules by mouth daily.     . Multiple Vitamin (MULTIVITAMIN) tablet Take 1 tablet by mouth daily. ALIVE 50+ GUMMY MULTIVITAMIN    . Multiple Vitamins-Minerals (MULTIVITAMIN WITH MINERALS) tablet Take 1 tablet by mouth daily.    . Potassium 99 MG TABS Take 1 tablet by mouth daily.    Marland Kitchen spironolactone (ALDACTONE) 25 MG tablet 1/2 tablet by mouth daily 15 tablet 3   No current facility-administered medications  for this visit.    Allergies:   Latex and Tramadol    Social History:  The patient  reports that she has never smoked. She has never used smokeless tobacco. She reports that she does not drink alcohol or use illicit drugs.   Family History:  The patient's family history includes Cancer in her paternal uncle; Cancer (age of onset: 90) in her father; Diabetes in her mother; Heart disease in her brother, mother, and sister; Hypertension in her mother and sister. There is no history of Heart attack or Stroke.    ROS:  Please see the history of present illness.    Otherwise, review of systems are positive for none.   All other systems are reviewed and negative.    PHYSICAL EXAM: VS:  BP 140/84 mmHg  Pulse 62  Ht 5\' 3"  (1.6 m)  Wt 79.561 kg (175 lb 6.4 oz)  BMI 31.08 kg/m2 , BMI Body mass index is 31.08 kg/(m^2). GENERAL:  Well appearing HEENT:  Pupils equal round and reactive, fundi not visualized, oral mucosa unremarkable NECK:  No jugular venous distention, waveform within normal limits, carotid upstroke brisk and symmetric, no bruits, no thyromegaly LYMPHATICS:  No cervical adenopathy LUNGS:  Clear to auscultation bilaterally HEART:  RRR.  PMI not displaced or sustained,S1 and S2 within normal limits, no S3, no S4, no clicks, no rubs, no murmurs ABD:  Flat, positive bowel sounds normal in frequency in pitch, no bruits, no rebound, no guarding, no midline pulsatile mass, no hepatomegaly, no splenomegaly EXT:  2 plus pulses throughout, no edema, no cyanosis no clubbing SKIN:  No rashes no nodules NEURO:  Cranial nerves II through XII grossly intact, motor grossly intact throughout PSYCH:  Cognitively intact, oriented to person place and time    EKG:  EKG is ordered today. The ekg ordered today demonstrates sinus rhythm rate 62 bpm.     Recent Labs: 10/06/2014: BUN 9; Creatinine, Ser 0.66; Hemoglobin 13.0; Platelets 223; Potassium 2.9*; Sodium 139   05/07/15 Sodium 141, potassium 3.3, BUN 13, creatinine 0.62 TSH 1.75 Total cholesterol 103, triglycerides 111, HDL 38, LDL 44  Lipid Panel No results found for: CHOL, TRIG, HDL, CHOLHDL, VLDL, LDLCALC, LDLDIRECT    Wt Readings from Last 3 Encounters:  06/23/15 79.561 kg (175 lb 6.4 oz)  12/14/14 74.844 kg (165 lb)  10/06/14 74.844 kg (165 lb)      ASSESSMENT AND PLAN:  # Hypertension: # Hypokalemia: Stephanie Stephanie [name] but pressure is slightly above goal on the amlodipine/losartan. We will start spironolactone 12.5 mg daily. I'm suspicious that she may have primary  hyperaldosteronism. We discussed the importance of taking her medications as directed every day, including weekends. However, given that she has not been doing this in the past 5*to hold that this weekend and return on Monday for a plasma regnin and an aldosterone level.  We will also check a basic metabolic panel to assess for hypokalemia and a magnesium level at that time.  # PVCs:  I suspect that her PVCs are attributable to hypokalemia. We will check a basic metabolic panel and magnesium as above. Potassium supplementation.   # Atypical Chest Pain: Stephanie Stephanie [name] symptoms are atypical. However, given her family history of premature CAD we will refer her for ETT to rule out ischemia.    Current medicines are reviewed at length with the patient today.  The patient does not have concerns regarding medicines.  The following changes have been made:  Start spironolactone   Labs/  tests ordered today include:   Orders Placed This Encounter  Procedures  . Aldosterone + renin activity w/ ratio  . Magnesium  . Basic metabolic panel  . Exercise Tolerance Test  . EKG 12-Lead     Disposition:   FU with Stephanie Folse C. Oval Linsey, MD, [facility] in 2 weeks   This note was written with the assistance of speech recognition software.  Please excuse any transcriptional errors.  Signed, Doral Digangi C. Oval Linsey, MD, [facility]  06/23/2015 1:07 PM    [facility]

## 2015-06-30 LAB — BASIC METABOLIC PANEL
BUN: 15 mg/dL (ref 7–25)
CHLORIDE: 101 mmol/L (ref 98–110)
CO2: 32 mmol/L — AB (ref 20–31)
Calcium: 9.4 mg/dL (ref 8.6–10.4)
Creat: 0.65 mg/dL (ref 0.50–1.05)
Glucose, Bld: 92 mg/dL (ref 65–99)
Potassium: 4.2 mmol/L (ref 3.5–5.3)
SODIUM: 141 mmol/L (ref 135–146)

## 2015-06-30 LAB — MAGNESIUM: MAGNESIUM: 2.1 mg/dL (ref 1.5–2.5)

## 2015-07-05 LAB — ALDOSTERONE + RENIN ACTIVITY W/ RATIO
ALDO / PRA RATIO: 19 ratio (ref 0.9–28.9)
Aldosterone: 4 ng/dL
PRA LC/MS/MS: 0.21 ng/mL/h — AB (ref 0.25–5.82)

## 2015-07-08 ENCOUNTER — Telehealth (HOSPITAL_COMMUNITY): Payer: Self-pay

## 2015-07-08 NOTE — Telephone Encounter (Signed)
Encounter complete. 

## 2015-07-10 ENCOUNTER — Ambulatory Visit (HOSPITAL_COMMUNITY)
Admission: RE | Admit: 2015-07-10 | Discharge: 2015-07-10 | Disposition: A | Discharge status: Home / Self Care | Payer: 59 | Source: Ambulatory Visit | Attending: Urology | Admitting: Urology

## 2015-07-10 DIAGNOSIS — I493 Ventricular premature depolarization: Secondary | ICD-10-CM | POA: Insufficient documentation

## 2015-07-10 LAB — EXERCISE TOLERANCE TEST
CHL CUP MPHR: 165 {beats}/min
CHL CUP RESTING HR STRESS: 61 {beats}/min
CHL RATE OF PERCEIVED EXERTION: 17
CSEPEDS: 21 s
CSEPEW: 9 METS
CSEPHR: 88 %
Exercise duration (min): 7 min
Peak HR: 146 {beats}/min

## 2015-07-14 ENCOUNTER — Telehealth: Payer: Self-pay | Admitting: Cardiovascular Disease

## 2015-07-14 NOTE — Telephone Encounter (Signed)
-----   Message from Skeet Latch, MD sent at 07/13/2015  7:49 PM EDT ----- Plasma renin and aldosterone levels are normal.  Kidney function and electrolytes are normal.

## 2015-07-14 NOTE — Telephone Encounter (Signed)
Advised patient of stress test and labs

## 2015-07-14 NOTE — Telephone Encounter (Signed)
F/u   Pt returning RN [place] call

## 2015-07-14 NOTE — Telephone Encounter (Signed)
Left message to call back  

## 2015-07-14 NOTE — Telephone Encounter (Signed)
-----   Message from Skeet Latch, MD sent at 07/13/2015  7:47 PM EDT ----- Stress test shows that there is no problem with the blood flow to heart heart.  However, her blood pressure is not well-controlled. It is good that we started a new BP medication.

## 2015-07-15 ENCOUNTER — Ambulatory Visit (INDEPENDENT_AMBULATORY_CARE_PROVIDER_SITE_OTHER): Payer: 59 | Admitting: Cardiovascular Disease

## 2015-07-15 ENCOUNTER — Encounter: Payer: Self-pay | Admitting: Cardiovascular Disease

## 2015-07-15 VITALS — BP 171/84 | HR 63 | Ht 63.0 in | Wt 176.6 lb

## 2015-07-15 DIAGNOSIS — I493 Ventricular premature depolarization: Secondary | ICD-10-CM

## 2015-07-15 DIAGNOSIS — I10 Essential (primary) hypertension: Secondary | ICD-10-CM | POA: Diagnosis not present

## 2015-07-15 DIAGNOSIS — E876 Hypokalemia: Secondary | ICD-10-CM | POA: Diagnosis not present

## 2015-07-15 MED ORDER — VALSARTAN 160 MG PO TABS: 160.0000 mg | ORAL_TABLET | Freq: Every day | ORAL | Status: DC

## 2015-07-15 MED ORDER — CARVEDILOL 12.5 MG PO TABS: 12.5000 mg | ORAL_TABLET | Freq: Two times a day (BID) | ORAL | Status: DC

## 2015-07-15 NOTE — Patient Instructions (Signed)
Medication Instructions:  STOP EXFORGE  START CARVEDILOL 12.5 MG TWICE A DAY  START VALSARTAN 160 MG DAILY   Labwork: BMET ON [facility] ON FIRST FLOOR WHEN YOU RETURN FOR YOUR RENAL STUDAY   Testing/Procedures: Your physician has requested that you have a renal artery duplex. During this test, an ultrasound is used to evaluate blood flow to the kidneys. Allow one hour for this exam. Do not eat after midnight the day before and avoid carbonated beverages. Take your medications as you usually do.  Follow-Up: Your physician recommends that you schedule a follow-up appointment in: [place] 140/90 INCREASE YOUR CARVEDILOL TO 25 MG TWICE A DAY   If you need a refill on your cardiac medications before your next appointment, please call your pharmacy.

## 2015-07-15 NOTE — Progress Notes (Signed)
Cardiology Office Note   Date:  07/15/2015   ID:  Stephanie White, DOB [birth year], MRN [id number]  PCP:  Antony Blackbird, MD  Cardiologist:   Skeet Latch, MD   Chief Complaint  Patient presents with  . Follow-up    Edema; ankles     History of Present Illness: Stephanie White is a 55 y.o. female with hypertension, hypokalemia, PVCs and hyperlipidemia who presents for follow up on palpitations and hypertension.  Stephanie White has noted palpitations for the last two years.  Her palpitations were felt to be due to PVCs in the setting of hypokalemia. Spironolactone was added to her medical regimen to help with both hypokalemia and blood pressure.  Serum renin and aldosterone levels were checked and were both normal.  She was referred for a treadmill stress test that revealed no ischemia but did have a hypertensive response to exercise.  She exercised for 7 minutes on a Bruce protocol, which is 9 METS. Her peak blood pressure was 163/100.  Overall Stephanie White has been doing well.  She continues to note palpitations when exercising or when she tries to go to bed at night.  She denies chest pain or shortness of breath.  She has noted lower extremity edema but it improves with elevation.  She denies orthopnea or PND.  She does not check her BP at home and continues to withhold her antihypertensives on the weekend.  She thinks that her hypertension is stress related and that she doesn't need it at home.   Her mother had a heart attack and diet at age 75.  Her brother has CHF.   Past Medical History  Diagnosis Date  . HTN (hypertension)   . Allergic rhinitis   . Hypokalemia   . Anemia   . Leukopenia 01/04/2012  . PVC (premature ventricular contraction) 06/23/2015    Past Surgical History  Procedure Laterality Date  . Endometrial ablation  12/2009  . Cesarean section      x2  . Colonoscopy  2012    reportedly negative per patient     Current Outpatient  Prescriptions  Medication Sig Dispense Refill  . Bacillus Coagulans-Inulin (PROBIOTIC FORMULA) 1-250 BILLION-MG CAPS Take 1 tablet by mouth daily.    . calcium-vitamin D (OSCAL WITH D) 500-200 MG-UNIT tablet Take 1 tablet by mouth daily with breakfast.    . Fish Oil OIL Take 3 capsules by mouth daily.     . Multiple Vitamin (MULTIVITAMIN) tablet Take 1 tablet by mouth daily. ALIVE 50+ GUMMY MULTIVITAMIN    . Multiple Vitamins-Minerals (MULTIVITAMIN WITH MINERALS) tablet Take 1 tablet by mouth daily.    . Potassium 99 MG TABS Take 1 tablet by mouth daily.    Marland Kitchen spironolactone (ALDACTONE) 25 MG tablet 1/2 tablet by mouth daily 15 tablet 3  . carvedilol (COREG) 12.5 MG tablet Take 1 tablet (12.5 mg total) by mouth 2 (two) times daily. 60 tablet 5  . valsartan (DIOVAN) 160 MG tablet Take 1 tablet (160 mg total) by mouth daily. 30 tablet 5   No current facility-administered medications for this visit.    Allergies:   Latex and Tramadol    Social History:  The patient  reports that she has never smoked. She has never used smokeless tobacco. She reports that she does not drink alcohol or use illicit drugs.   Family History:  The patient's family history includes Cancer in her paternal uncle; Cancer (age of onset: 48) in her father; Diabetes in her mother;  Heart disease in her brother, mother, and sister; Hypertension in her mother and sister. There is no history of Heart attack or Stroke.    ROS:  Please see the history of present illness.   Otherwise, review of systems are positive for none.   All other systems are reviewed and negative.    PHYSICAL EXAM: VS:  BP 171/84 mmHg  Pulse 63  Ht 5\' 3"  (1.6 m)  Wt 176 lb 9.6 oz (80.105 kg)  BMI 31.29 kg/m2 , BMI Body mass index is 31.29 kg/(m^2). GENERAL:  Well appearing HEENT:  Pupils equal round and reactive, fundi not visualized, oral mucosa unremarkable NECK:  No jugular venous distention, waveform within normal limits, carotid upstroke brisk  and symmetric, no bruits LYMPHATICS:  No cervical adenopathy LUNGS:  Clear to auscultation bilaterally HEART:  RRR.  PMI not displaced or sustained,S1 and S2 within normal limits, no S3, no S4, no clicks, no rubs, no murmurs ABD:  Flat, positive bowel sounds normal in frequency in pitch, no bruits, no rebound, no guarding, no midline pulsatile mass, no hepatomegaly, no splenomegaly EXT:  2 plus pulses throughout, no edema, no cyanosis no clubbing SKIN:  No rashes no nodules NEURO:  Cranial nerves II through XII grossly intact, motor grossly intact throughout PSYCH:  Cognitively intact, oriented to person place and time    EKG:  EKG is not ordered today. The ekg ordered 06/23/15 demonstrates sinus rhythm rate 62 bpm.     Recent Labs: 10/06/2014: Hemoglobin 13.0; Platelets 223 06/29/2015: BUN 15; Creat 0.65; Magnesium 2.1; Potassium 4.2; Sodium 141   05/07/15 Sodium 141, potassium 3.3, BUN 13, creatinine 0.62 TSH 1.75 Total cholesterol 103, triglycerides 111, HDL 38, LDL 44  Lipid Panel No results found for: CHOL, TRIG, HDL, CHOLHDL, VLDL, LDLCALC, LDLDIRECT    Wt Readings from Last 3 Encounters:  07/15/15 176 lb 9.6 oz (80.105 kg)  06/23/15 175 lb 6.4 oz (79.561 kg)  12/14/14 165 lb (74.844 kg)      ASSESSMENT AND PLAN:  # Hypertension: # Hypokalemia: Stephanie White remains above goal.  She also complains of LE edema.  We will stop the amlodipine and start carvedilol 12.5 mg bid.  She will continue valsartan 160 mg daily.  Continue spironolactone, as this seems to be helping with her hypokalemia.  We wiil repeat a BMP in 1 week.  Given that her BP remains difficult to control, we will check renal artery Dopplers.   # PVCs:  She continues to have PVCs even though she is no longer hypokalemic.  We will start carvedilol for both BP control and PVCs.   # Atypical Chest Pain: ETT was negative for ischemia.  Current medicines are reviewed at length with the patient today.  The  patient does not have concerns regarding medicines.  The following changes have been made: Stop the amlodipine/valsartan combination pill.  Start valsartan 160 mg daily and carvedilol 12.5 mg bid.   Labs/ tests ordered today include:   Orders Placed This Encounter  Procedures  . Basic metabolic panel     Disposition:   FU with Emanii Bugbee C. Oval Linsey, MD, [facility] in 1 month.    This note was written with the assistance of speech recognition software.  Please excuse any transcriptional errors.  Signed, Kenry Daubert C. Oval Linsey, MD, [facility]  07/15/2015 4:57 PM    [place]

## 2015-07-17 LAB — BASIC METABOLIC PANEL
BUN: 15 mg/dL (ref 7–25)
CHLORIDE: 101 mmol/L (ref 98–110)
CO2: 28 mmol/L (ref 20–31)
CREATININE: 0.61 mg/dL (ref 0.50–1.05)
Calcium: 8.9 mg/dL (ref 8.6–10.4)
Glucose, Bld: 99 mg/dL (ref 65–99)
POTASSIUM: 3.7 mmol/L (ref 3.5–5.3)
Sodium: 139 mmol/L (ref 135–146)

## 2015-07-29 ENCOUNTER — Telehealth: Payer: Self-pay | Admitting: Cardiovascular Disease

## 2015-07-29 NOTE — Telephone Encounter (Signed)
Recommendations communicated to patient.  She does note the BP cuff was calibrated at PCP office last year. She had some reservations about the necessity of doing this again, but she states she will discuss w Dr Oval Linsey at appt next week, and for now do BID BP checks as instructed.

## 2015-07-29 NOTE — Telephone Encounter (Signed)
Left msg for pt to call. 

## 2015-07-29 NOTE — Telephone Encounter (Signed)
I spoke to patient. She reports BPs have been running high. Lists several readings taken, usually she checks both arms and notes a difference between left and right arm readings.  We discussed taking BP at rest, taking at a consistent time of day.  She states she was concerned and checked her BP this AM around 5 because she'd had a headache off and on for past 2 days. Reading was 187/90. Rechecked again at 8 and it was 149/77.  She notes readings on Sun: 180/83 R arm, 142/74 L arm in the afternoon, 156/76 R and 147/77 L in the evening.  Pt missed BP med doses on Saturday. "Forgot because I was working out in the yard."  We discussed the importance of taking her BP medication.  Pt became a little bit frustrated at my suggestion that she remember to take her BP meds daily, though she reported the missed dose Saturday. She states she hasn't missed any doses w/e of Saturday, since seen in office last.  Pt has visit w/ Dr. Oval Linsey in 1 week. In the interim, she is hoping to address the L&R arm reading discrepancies, and what she sees as worrisome BP fluctuations throughout the day. Notes her BP runs between 100/60 and 190/90.

## 2015-07-29 NOTE — Telephone Encounter (Signed)
There is usual fluctuation of BP throughout the day, generally not as drastic as the fluctuations she is experiencing though. Same goes with differences in BP between arms. As mentioned should take BP at rest. Have her check 3 times on same arm and take average of 3. Take BP twice a day at approximately the same time each day and note any symptoms. Bring log with her to appt with Dr. Oval Linsey.   Are the higher blood pressure readings prior to taking her medications in the morning?   It does not look like we have verified the accuracy of her cuff so we could schedule her for a BP visit with pharmacy to verify the accuracy of her cuff as well.

## 2015-07-29 NOTE — Telephone Encounter (Signed)
Left msg to call.

## 2015-07-29 NOTE — Telephone Encounter (Signed)
New message      Pt c/o BP issue: STAT if pt c/o blurred vision, one-sided weakness or slurred speech  1. What are your last 5 BP readings? This am rt arm 187/90, lt arm 192/97, then around 8am rt arm 149/77 and lt arm 126/72, 6/13 in am 157/87 and lt arm 132/71 and around 6:30 or 7pm on 6/13 rt arm 172/86 and lt arm 154/79  2. Are you having any other symptoms (ex. Dizziness, headache, blurred vision, passed out)? No just feeling tied  3. What is your BP issue? The pt states she feels as through her blood pressure medications are not working. Would like to speak with nurse.

## 2015-07-30 ENCOUNTER — Ambulatory Visit (HOSPITAL_COMMUNITY)
Admission: RE | Admit: 2015-07-30 | Discharge: 2015-07-30 | Disposition: A | Discharge status: Home / Self Care | Payer: 59 | Source: Ambulatory Visit | Attending: Internal Medicine | Admitting: Internal Medicine

## 2015-07-30 DIAGNOSIS — I10 Essential (primary) hypertension: Secondary | ICD-10-CM | POA: Insufficient documentation

## 2015-08-04 ENCOUNTER — Telehealth: Payer: Self-pay | Admitting: *Deleted

## 2015-08-04 NOTE — Telephone Encounter (Signed)
Left message to call back  

## 2015-08-04 NOTE — Telephone Encounter (Signed)
-----   Message from Skeet Latch, MD sent at 07/31/2015  2:26 PM EDT ----- Normal flow to the renal arteries

## 2015-08-05 NOTE — Telephone Encounter (Signed)
Notes Recorded by Skeet Latch, MD on 07/31/2015 at 2:26 PM Normal flow to the renal arteries

## 2015-08-05 NOTE — Telephone Encounter (Signed)
Follow-up ° ° ° ° ° ° °The pt is returning Melinda's call °

## 2015-08-05 NOTE — Telephone Encounter (Signed)
Follow up      Pt is calling to get test results.

## 2015-08-05 NOTE — Telephone Encounter (Signed)
Left msg to call back.  Operators: Pt has called back several times, OK to call us and transfer her to triage.

## 2015-08-05 NOTE — Telephone Encounter (Signed)
Results given to patient who voiced understanding. She is aware of f/u visit tomorrow. No further questions and aware she may call if q's or concerns.

## 2015-08-06 ENCOUNTER — Encounter: Payer: Self-pay | Admitting: Cardiovascular Disease

## 2015-08-06 ENCOUNTER — Ambulatory Visit (INDEPENDENT_AMBULATORY_CARE_PROVIDER_SITE_OTHER): Payer: 59 | Admitting: Cardiovascular Disease

## 2015-08-06 VITALS — BP 182/110 | HR 95 | Ht 63.0 in | Wt 184.0 lb

## 2015-08-06 DIAGNOSIS — I493 Ventricular premature depolarization: Secondary | ICD-10-CM

## 2015-08-06 DIAGNOSIS — E876 Hypokalemia: Secondary | ICD-10-CM

## 2015-08-06 DIAGNOSIS — I10 Essential (primary) hypertension: Secondary | ICD-10-CM | POA: Diagnosis not present

## 2015-08-06 MED ORDER — VALSARTAN 320 MG PO TABS: 320.0000 mg | ORAL_TABLET | Freq: Every day | ORAL | Status: DC

## 2015-08-06 NOTE — Progress Notes (Signed)
Cardiology Office Note   Date:  08/06/2015   ID:  Stephanie White, DOB [birth year], MRN [id number]  PCP:  Antony Blackbird, MD  Cardiologist:   Skeet Latch, MD   Chief Complaint  Patient presents with  . Follow-up    Pt states no Sx.     History of Present Illness: Stephanie White is a 55 y.o. female with hypertension, hypokalemia, PVCs and hyperlipidemia who presents for follow up on palpitations and hypertension.  Stephanie White has noted palpitations for the last two years.  Her palpitations were felt to be due to PVCs in the setting of hypokalemia. Spironolactone was added to her medical regimen to help with both hypokalemia and blood pressure.  Serum renin and aldosterone levels were checked and were both normal.  She was referred for a treadmill stress test that revealed no ischemia but did have a hypertensive response to exercise.  She exercised for 7 minutes on a Bruce protocol, which is 9 METS. Her peak blood pressure was 163/100.  At her last appointment her blood pressure remained poorly-controlled and she reported lower extremity edema. Amlodipine/valsartan was discontinued. She was started on valsartan 160 mg daily and carvedilol 12.5 mg twice a day.  Since that appointment her blood pressure remains poorly-controlled.  She has also noted a discrepancy in her blood pressure.  The blood pressure is consistently higher in the right arm.  She denies arm pain or weakness.  She has noted headaches since her BP has been elevated.    Stephanie White has a renal artery ultrasound that was negative for stenosis.  She is especially concerned about her blood pressure because her mother had a heart attack and died at age 62.  Her brother also has congestive heart failure.  Stephanie White denies lower extremity edema, orthopnea, or PND.    Past Medical History  Diagnosis Date  . HTN (hypertension)   . Allergic rhinitis   . Hypokalemia   . Anemia   . Leukopenia  01/04/2012  . PVC (premature ventricular contraction) 06/23/2015    Past Surgical History  Procedure Laterality Date  . Endometrial ablation  12/2009  . Cesarean section      x2  . Colonoscopy  2012    reportedly negative per patient     Current Outpatient Prescriptions  Medication Sig Dispense Refill  . Bacillus Coagulans-Inulin (PROBIOTIC FORMULA) 1-250 BILLION-MG CAPS Take 1 tablet by mouth daily.    . calcium-vitamin D (OSCAL WITH D) 500-200 MG-UNIT tablet Take 1 tablet by mouth daily with breakfast.    . carvedilol (COREG) 12.5 MG tablet Take 1 tablet (12.5 mg total) by mouth 2 (two) times daily. 60 tablet 5  . Fish Oil OIL Take 3 capsules by mouth daily.     . Multiple Vitamin (MULTIVITAMIN) tablet Take 1 tablet by mouth daily. ALIVE 50+ GUMMY MULTIVITAMIN    . Multiple Vitamins-Minerals (MULTIVITAMIN WITH MINERALS) tablet Take 1 tablet by mouth daily.    . Potassium 99 MG TABS Take 1 tablet by mouth daily.    Marland Kitchen spironolactone (ALDACTONE) 25 MG tablet 1/2 tablet by mouth daily 15 tablet 3  . valsartan (DIOVAN) 160 MG tablet Take 1 tablet (160 mg total) by mouth daily. 30 tablet 5   No current facility-administered medications for this visit.    Allergies:   Latex and Tramadol    Social History:  The patient  reports that she has never smoked. She has never used smokeless tobacco. She reports that she does  not drink alcohol or use illicit drugs.   Family History:  The patient's family history includes Cancer in her paternal uncle; Cancer (age of onset: 33) in her father; Diabetes in her mother; Heart disease in her brother, mother, and sister; Hypertension in her mother and sister. There is no history of Heart attack or Stroke.    ROS:  Please see the history of present illness.   Otherwise, review of systems are positive for none.   All other systems are reviewed and negative.    PHYSICAL EXAM: VS:  BP 182/110 mmHg  Pulse 95  Ht 5\' 3"  (1.6 m)  Wt 184 lb (83.462 kg)   BMI 32.60 kg/m2 , BMI Body mass index is 32.6 kg/(m^2). GENERAL:  Well appearing HEENT:  Pupils equal round and reactive, fundi not visualized, oral mucosa unremarkable NECK:  No jugular venous distention, waveform within normal limits, carotid upstroke brisk and symmetric, no bruits LYMPHATICS:  No cervical adenopathy LUNGS:  Clear to auscultation bilaterally HEART: Mostly regular with occasional ectopy.  PMI not displaced or sustained,S1 and S2 within normal limits, no S3, no S4, no clicks, no rubs, no murmurs ABD:  Flat, positive bowel sounds normal in frequency in pitch, no bruits, no rebound, no guarding, no midline pulsatile mass, no hepatomegaly, no splenomegaly EXT:  R radial pulses 2+, L radial 1+ no edema, no cyanosis no clubbing SKIN:  No rashes no nodules NEURO:  Cranial nerves II through XII grossly intact, motor grossly intact throughout PSYCH:  Cognitively intact, oriented to person place and time  EKG:  EKG is not ordered today. The ekg ordered 06/23/15 demonstrates sinus rhythm rate 62 bpm.     Recent Labs: 10/06/2014: Hemoglobin 13.0; Platelets 223 06/29/2015: Magnesium 2.1 07/15/2015: BUN 15; Creat 0.61; Potassium 3.7; Sodium 139   05/07/15 Sodium 141, potassium 3.3, BUN 13, creatinine 0.62 TSH 1.75 Total cholesterol 103, triglycerides 111, HDL 38, LDL 44  Lipid Panel No results found for: CHOL, TRIG, HDL, CHOLHDL, VLDL, LDLCALC, LDLDIRECT    Wt Readings from Last 3 Encounters:  08/06/15 184 lb (83.462 kg)  07/15/15 176 lb 9.6 oz (80.105 kg)  06/23/15 175 lb 6.4 oz (79.561 kg)      ASSESSMENT AND PLAN:  # Hypertension: # Hypokalemia: Stephanie White remains above goal.  Renal artery Doppler was negative for stenosis.  Amlodipine was stopped due to lower extremity edema.  She has hypokalemia so HCTZ is not ideal.  She was started on carvedilol 12.5mg  bid and her BP remains poorly-controlled.  We will increase valsartan to 320mg  daily.  I have asked her to  take an extra dose of 160 mg this evening and start 320mg  tomorrow. Continue spironolactone and increase to 25 mg if her BP remains elevated.  This is also helping her hypokalemia.  # Discrepant BP: BP is lower in the L arm and pulse is diminished compared with the right.  We will check a CT to assess for stenosis.  # PVCs:  PVCs persist but improved on carvedilol.    # Atypical Chest Pain: ETT was negative for ischemia.    Current medicines are reviewed at length with the patient today.  The patient does not have concerns regarding medicines.  The following changes have been made: Stop the amlodipine/valsartan combination pill.  Start valsartan 160 mg daily and carvedilol 12.5 mg bid.   Labs/ tests ordered today include:   No orders of the defined types were placed in this encounter.     Disposition:  FU with Ugonna Keirsey C. Oval Linsey, MD, [facility] in 1 month.    This note was written with the assistance of speech recognition software.  Please excuse any transcriptional errors.  Signed, Sharda Keddy C. Oval Linsey, MD, [facility]  08/06/2015 3:28 PM    [facility]

## 2015-08-06 NOTE — Patient Instructions (Addendum)
Medication Instructions:  TAKE AN EXTRA VALSARTAN TODAY  STARTING TOMORROW INCREASE YOUR VALSARTAN TO 320 MG DAILY  Labwork: NONE  Testing/Procedures: I will call you with your appointment date and time for CTA of chest   Follow-Up: Your physician recommends that you schedule a follow-up appointment in: [place] 1-2 WEEKS  Your physician recommends that you schedule a follow-up appointment in: DR [facility] 1 MONTH  Any Other Special Instructions Will Be Listed Below (If Applicable). MONITOR YOUR BLOOD PRESSURE AT HOME AND IF IT IS NOT CONSISTENTLY  BELOW 140/90 INCREASE YOUR SPIRONOLACTONE TO A FULL TABLET DAILY   If you need a refill on your cardiac medications before your next appointment, please call your pharmacy.

## 2015-08-07 ENCOUNTER — Telehealth: Payer: Self-pay | Admitting: Cardiovascular Disease

## 2015-08-07 ENCOUNTER — Ambulatory Visit (INDEPENDENT_AMBULATORY_CARE_PROVIDER_SITE_OTHER)
Admission: RE | Admit: 2015-08-07 | Discharge: 2015-08-07 | Disposition: A | Discharge status: Home / Self Care | Payer: 59 | Source: Ambulatory Visit | Attending: Cardiovascular Disease | Admitting: Cardiovascular Disease

## 2015-08-07 DIAGNOSIS — I10 Essential (primary) hypertension: Secondary | ICD-10-CM | POA: Diagnosis not present

## 2015-08-07 MED ORDER — IOPAMIDOL (ISOVUE-370) INJECTION 76%
100.0000 mL | Freq: Once | INTRAVENOUS | Status: AC | PRN
  Administered 2015-08-07: 100 mL via INTRAVENOUS

## 2015-08-07 NOTE — Telephone Encounter (Signed)
Follow up ° ° ° ° °Returned a call to the nurse °

## 2015-08-07 NOTE — Telephone Encounter (Signed)
Patients blood pressure remaining in the 160's even after taking the Valsartan 160 mg tablet Advised to try taking the 320 mg at one time over the weekend and touch base with me on Monday Also she is concerned with her potassium being low secondary to having palpitations again. Advised to increase her potassium intake with food

## 2015-08-07 NOTE — Telephone Encounter (Signed)
Follow-up     Pt c/o medication issue:  1. Name of Medication: Valsartan  2. How are you currently taking this medication (dosage and times per day) 320 mg po daily  3. Are you having a reaction (difficulty breathing--STAT)? no  4. What is your medication issue?the pt wants 2 pills of medication to take 1 in am and 1 pill at night, a new prescription of 160 mg taking 2 pills daily

## 2015-08-07 NOTE — Telephone Encounter (Signed)
Left message to call back  

## 2015-08-07 NOTE — Telephone Encounter (Signed)
Stephanie White is wanting to speak with you about her medication Valsartan . Please call   Thanks

## 2015-08-10 MED ORDER — VALSARTAN 160 MG PO TABS: 160.0000 mg | ORAL_TABLET | Freq: Two times a day (BID) | ORAL | Status: DC

## 2015-08-10 NOTE — Telephone Encounter (Signed)
Spoke with patient and she feels Valsartan 320 mg once daily is not working well for her and does not feel well with this dose She would like to try the Valsartan 160 mg twice a day and only took 160 mg this am Also wanted to know if supplement Nerve Blend 14 would have anything in it that would elevate her blood pressure Discussed both with Jeanine Luz Pharm D and ok to try the 160 mg daily and supplement ok but could hold to see if it makes a difference Left message to call back

## 2015-08-10 NOTE — Telephone Encounter (Signed)
Follow up   Pt is calling to speak to the rn  She states she missed melinda's call

## 2015-08-10 NOTE — Telephone Encounter (Signed)
Follow-up     The pt is calling to speak with the nurse.

## 2015-08-10 NOTE — Telephone Encounter (Signed)
When I spoke to patient regarding Valsartan she had some concerns She does not feel the Valsartan is working like it should Yesterday her blood pressure was 198/100 when she woke up but came down to 160/77 after taking medications Patient concerned that just increasing the medication instead of changing to something else since she has been on the Valsartan my not be the best choice She thinks maybe the Valsartan is just not working for her anymore Concerned with the up/down of blood pressure and how high it is getting States her blood pressure has never been this high before She does have appointment with Pharm D next week but she requested information be sent to Dr Oval Linsey, will forward to her for review

## 2015-08-11 NOTE — Telephone Encounter (Signed)
Please continue the valsartan at the higher dose.  Also increase carvedilol to 25 mg bid and spironolactone to 25mg  daily.  Keep PharmD appointment.

## 2015-08-12 NOTE — Telephone Encounter (Signed)
-----   Message from Skeet Latch, MD sent at 08/10/2015 12:55 PM EDT ----- No blockages in the subclavian arteries that would explain the differences in blood pressure.  There were very small lung nodules.  Given that she has never smoked she is at low risk and per radiology, the CT does not need to be repeated.

## 2015-08-12 NOTE — Telephone Encounter (Signed)
Pt will be in a meeting until after 5p-pls call back then

## 2015-08-12 NOTE — Telephone Encounter (Signed)
Follow up ° ° ° ° ° °Returning a call to the nurse °

## 2015-08-12 NOTE — Telephone Encounter (Signed)
Left message to call back  

## 2015-08-14 MED ORDER — SPIRONOLACTONE 25 MG PO TABS: 25.0000 mg | ORAL_TABLET | Freq: Every day | ORAL | Status: DC

## 2015-08-14 NOTE — Telephone Encounter (Signed)
Spoke to patient and gave her review of CT findings. Pt voiced understanding and thanks.

## 2015-08-14 NOTE — Telephone Encounter (Signed)
Advised patient of medication changes She will keep follow up on Monday with Pharm D

## 2015-08-14 NOTE — Addendum Note (Signed)
Addended by: Alvina Filbert B on: 08/14/2015 10:10 AM   Modules accepted: Orders, Medications

## 2015-08-14 NOTE — Telephone Encounter (Signed)
-----   Message from Skeet Latch, MD sent at 08/10/2015 12:55 PM EDT ----- No blockages in the subclavian arteries that would explain the differences in blood pressure.  There were very small lung nodules.  Given that she has never smoked she is at low risk and per radiology, the CT does not need to be repeated.

## 2015-08-14 NOTE — Telephone Encounter (Signed)
Pt called back,said she was told to ask for any triage nurse.

## 2015-08-14 NOTE — Telephone Encounter (Signed)
Left message to call back  

## 2015-08-17 ENCOUNTER — Encounter: Payer: Self-pay | Admitting: Pharmacist

## 2015-08-17 ENCOUNTER — Ambulatory Visit (INDEPENDENT_AMBULATORY_CARE_PROVIDER_SITE_OTHER): Payer: 59 | Admitting: Pharmacist

## 2015-08-17 VITALS — BP 148/88 | HR 53

## 2015-08-17 DIAGNOSIS — I493 Ventricular premature depolarization: Secondary | ICD-10-CM

## 2015-08-17 DIAGNOSIS — I10 Essential (primary) hypertension: Secondary | ICD-10-CM

## 2015-08-17 LAB — BASIC METABOLIC PANEL
BUN: 18 mg/dL (ref 7–25)
CHLORIDE: 102 mmol/L (ref 98–110)
CO2: 29 mmol/L (ref 20–31)
Calcium: 9.3 mg/dL (ref 8.6–10.4)
Creat: 0.66 mg/dL (ref 0.50–1.05)
GLUCOSE: 70 mg/dL (ref 65–99)
POTASSIUM: 4 mmol/L (ref 3.5–5.3)
Sodium: 140 mmol/L (ref 135–146)

## 2015-08-17 MED ORDER — AMLODIPINE BESYLATE 2.5 MG PO TABS: 2.5000 mg | ORAL_TABLET | Freq: Every day | ORAL | Status: DC

## 2015-08-17 NOTE — Progress Notes (Signed)
Patient ID: Stephanie White                 DOB: October 26, 1960                      MRN: [id number]     HPI: Stephanie White is a 55 y.o. female referred by Dr. Oval Linsey to HTN clinic for evaluation. She was recently seen and her blood pressure medications were changed due to potassium issues. These medications have since been titrated about 2 weeks ago.   She reports that she has been having palpitations off and on and states that previously this was related to her potassium level.   She reports a high level of stress at work over the last few months as they have been "restructing" and have been laying people off. Her husband is also in a similar situation with his job so this has been a significant source of stress. She states she did find out at the end of the week last week that her job is safe so hopefully with the decrease in stress her pressure will improve. She also reports that she has been having headaches since her blood pressure has been elevated.   She has been evaluated for renal artery stenosis and those results were negative. She has never had a sleep study but does report her husband says she "breaths heavy" at times throughout the night. She also reports her pressure are usually more elevated in the mornings.   Current HTN meds:  Carvedilol 25mg  BID Spironolactone 25mg  each morning Valsartan 160mg  BID  Previously tried: amlodipine - swelling - started on 10mg  initially HCTZ - potassium issues  BP goal: <140/90  Family History: Mother - died of MI at 51yo. Father no cardiac issues that she is aware of. Her brother is 81 and he has congestive HF. Her sister is 65 and she has HTN.   Social History: never smoker. She does not drink alcohol because she is scared of how it will mix with her medications.   Diet: She eats most of her meals at home. Her diet consists of mostly veggies and fruits. A typical day is : Breakfast - oatmeal and 2 boiled eggs with small amount of  salt/pepper 1st snack: banana with almonds, pumpkin and sunflower seeds 2nd snack: "veggie bag" (peppers, carrots, cucumbers, tomatoes) Lunch: small piece of chicken and vegetables Dinner: salad or smoothie  She states she tries to be mindful of sodium intake and has a DASH diet book from her primary care.  She drinks a lot of water. 1 cup of coffee per day and rarely has tea. She states she is very salt sensitive and notices when they eat out or if husband prepares food.   Exercise: She works out 4 days a week for 30-60 minutes doing cardio or strength videos from [employer].  Home BP readings: wrist cuff that when used appropriately measures within 84mmHg.  Home blood pressure readings are rather erratic.  She checks twice a day once in each arm - over the last few weeks her blood pressure is usually higher in her right arm. She states Dr. Oval Linsey has done work up to see the cause of this which has all returned negative.   In the last 2 weeks 17/30 readings [id number] systolic with 1 [id number].    Wt Readings from Last 3 Encounters:  08/06/15 184 lb (83.462 kg)  07/15/15 176 lb 9.6 oz (80.105 kg)  06/23/15 175 lb 6.4 oz (  79.561 kg)   BP Readings from Last 3 Encounters:  08/06/15 182/110  07/15/15 171/84  06/23/15 140/84   Pulse Readings from Last 3 Encounters:  08/06/15 95  07/15/15 63  06/23/15 62    Renal function: CrCl cannot be calculated (Patient has no serum creatinine result on file.).  Past Medical History  Diagnosis Date  . HTN (hypertension)   . Allergic rhinitis   . Hypokalemia   . Anemia   . Leukopenia 01/04/2012  . PVC (premature ventricular contraction) 06/23/2015    Current Outpatient Prescriptions on File Prior to Visit  Medication Sig Dispense Refill  . Bacillus Coagulans-Inulin (PROBIOTIC FORMULA) 1-250 BILLION-MG CAPS Take 1 tablet by mouth daily.    . calcium-vitamin D (OSCAL WITH D) 500-200 MG-UNIT tablet Take 1 tablet by mouth daily with breakfast.    .  carvedilol (COREG) 12.5 MG tablet Take 1 tablet (12.5 mg total) by mouth 2 (two) times daily. (Patient taking differently: Take 25 mg by mouth 2 (two) times daily. ) 60 tablet 5  . Fish Oil OIL Take 3 capsules by mouth daily.     . Multiple Vitamin (MULTIVITAMIN) tablet Take 1 tablet by mouth daily. ALIVE 50+ GUMMY MULTIVITAMIN    . Multiple Vitamins-Minerals (MULTIVITAMIN WITH MINERALS) tablet Take 1 tablet by mouth daily.    . Potassium 99 MG TABS Take 1 tablet by mouth daily.    Marland Kitchen spironolactone (ALDACTONE) 25 MG tablet Take 1 tablet (25 mg total) by mouth daily. 30 tablet 5  . valsartan (DIOVAN) 160 MG tablet Take 1 tablet (160 mg total) by mouth 2 (two) times daily. 60 tablet 5   No current facility-administered medications on file prior to visit.    Allergies  Allergen Reactions  . Amlodipine Swelling  . Latex Rash  . Tramadol Palpitations    Rapid heart rate     Assessment/Plan: Hypertension: BP is not at goal <140/90 though it is slightly improved from previous visit. She is hesitant to start any new medications, but she is currently on max dose of her current medications. Discussed medication options including side effects. She is wishing to do a trial at the lower dose of amlodipine to see if this will get her to goal without causing swelling. Have asked her to take 2.5mg  in the evening to try to force the BP dipping pattern. Instructed her to call if she experiences any swelling.   Will get BMET today to assess potassium since have palpitations and recent dose change of spironolactone.   She may benefit from sleep study to ensure she does not have sleep apnea and this is not contributing to her blood pressure.   Follow-up in BP clinic in 1 month.   Thank you, Lelan Pons. Patterson Hammersmith, [facility]  08/17/2015 8:38 AM

## 2015-08-17 NOTE — Assessment & Plan Note (Signed)
BP is not at goal <140/90 though it is slightly improved from previous visit. She is hesitant to start any new medications, but she is currently on max dose of her current medications. Discussed medication options including side effects. She is wishing to do a trial at the lower dose of amlodipine to see if this will get her to goal without causing swelling. Have asked her to take 2.5mg  in the evening to try to force the BP dipping pattern. Instructed her to call if she experiences any swelling.   She may benefit from sleep study to ensure she does not have sleep apnea and this is not contributing to her blood pressure.   Follow-up in BP clinic in 1 month.

## 2015-08-17 NOTE — Patient Instructions (Signed)
Return for a a follow up appointment in 1 month.   Your blood pressure today is 148/88   Check your blood pressure at home daily (if able) and keep record of the readings.  Take your BP meds as follows: Continue carvedilol, spironolactone, and valsartan   Start taking amlodipine 2.5mg  once daily in the evening. Call if you begin to notice swelling in your legs.    Bring all of your meds, your BP cuff and your record of home blood pressures to your next appointment.  Exercise as you're able, try to walk approximately 30 minutes per day.  Keep salt intake to a minimum, especially watch canned and prepared boxed foods.  Eat more fresh fruits and vegetables and fewer canned items.  Avoid eating in fast food restaurants.    HOW TO TAKE YOUR BLOOD PRESSURE: . Rest 5 minutes before taking your blood pressure. .  Don't smoke or drink caffeinated beverages for at least 30 minutes before. . Take your blood pressure before (not after) you eat. . Sit comfortably with your back supported and both feet on the floor (don't cross your legs). . Elevate your arm to heart level on a table or a desk. . Use the proper sized cuff. It should fit smoothly and snugly around your bare upper arm. There should be enough room to slip a fingertip under the cuff. The bottom edge of the cuff should be 1 inch above the crease of the elbow. . Ideally, take 3 measurements at one sitting and record the average.

## 2015-08-20 ENCOUNTER — Other Ambulatory Visit: Payer: Self-pay | Admitting: Cardiovascular Disease

## 2015-08-20 MED ORDER — AMLODIPINE BESYLATE 2.5 MG PO TABS: 2.5000 mg | ORAL_TABLET | Freq: Every day | ORAL | Status: DC

## 2015-08-20 MED ORDER — CARVEDILOL 25 MG PO TABS: 25.0000 mg | ORAL_TABLET | Freq: Two times a day (BID) | ORAL | Status: DC

## 2015-08-20 NOTE — Telephone Encounter (Signed)
L/m on voicemail informing patient that rx's have been sent to the pharmacy

## 2015-08-20 NOTE — Telephone Encounter (Signed)
New message       *STAT* If patient is at the pharmacy, call can be transferred to refill team.   1. Which medications need to be refilled? (please list name of each medication and dose if known) carvedilol 12.5mg  and amlodipine 2.5mg   2. Which pharmacy/location (including street and city if local pharmacy) is [facility] to[facility]  3. Do they need a 30 day or 90 day supply? 90 day

## 2015-08-27 ENCOUNTER — Telehealth: Payer: Self-pay | Admitting: Pharmacist Clinician (PhC)/ Clinical Pharmacy Specialist

## 2015-08-27 NOTE — Telephone Encounter (Signed)
Lets try diltiazem CD 120 mg daily.  Please ask her to monitor her heart rate to ensure she does not get bradycardic.

## 2015-08-27 NOTE — Telephone Encounter (Signed)
Patient called this am.  Is concerned because still having problems with palpitations.  States that they occur most evenings and are limiting her ability to exercise.  They last into the night, making it difficult to go to sleep.  She is currently taking carvedilol 25 mg bid and states compliance with medication.  She has appointment with Dr. Oval Linsey on July 31, but would like to know if she can do something before then.  Advised patient that I will route her message to Dr. Oval Linsey and her nurse.

## 2015-08-28 ENCOUNTER — Encounter: Payer: Self-pay | Admitting: Cardiovascular Disease

## 2015-08-28 NOTE — Telephone Encounter (Signed)
Left message to call back  

## 2015-08-28 NOTE — Telephone Encounter (Signed)
Spoke with patient and she stated yesterday she had a good day without being bothered by palpitations She did take the Carvedilol 25 mg twice a day for the first time yesterday, had been taking 12.5 mg twice a day.  Patient prefers to continue this current dose over the weekend and call back next week with update

## 2015-08-28 NOTE — Telephone Encounter (Signed)
This encounter was created in error - please disregard.

## 2015-08-28 NOTE — Telephone Encounter (Signed)
Returning your call. °

## 2015-08-31 ENCOUNTER — Telehealth: Payer: Self-pay | Admitting: Cardiovascular Disease

## 2015-08-31 MED ORDER — VALSARTAN 160 MG PO TABS: 160.0000 mg | ORAL_TABLET | Freq: Two times a day (BID) | ORAL | Status: DC

## 2015-08-31 MED ORDER — SPIRONOLACTONE 25 MG PO TABS: 25.0000 mg | ORAL_TABLET | Freq: Every day | ORAL | Status: DC

## 2015-08-31 NOTE — Telephone Encounter (Signed)
°*  STAT* If patient is at the pharmacy, call can be transferred to refill team.   1. Which medications need to be refilled? (please list name of each medication and dose if known) Valsartan 160mg  , Spironolactone 25mg  ( Need [facility] Pres[facility] )    2. Which pharmacy/location (including street and city if local pharmacy) is medication to be sent to?[facility]  3. Do they need a 30 day or 90 day supply? [place]

## 2015-08-31 NOTE — Telephone Encounter (Signed)
Refill sent to the pharmacy electronically.  

## 2015-09-03 ENCOUNTER — Other Ambulatory Visit: Payer: Self-pay | Admitting: Cardiovascular Disease

## 2015-09-03 NOTE — Telephone Encounter (Signed)
Her prescription is pending because it needs to be for 90 days.  *STAT* If patient is at the pharmacy, call can be transferred to refill team.   1. Which medications need to be refilled? (please list name of each medication and dose if known) Please resend her Valsartan and Spironolactone-need this asap  2. Which pharmacy/location (including street and city if local pharmacy) is medication to be sent to?[facility] 3. Do they need a 30 day or 90 day supply? 90 days and refills

## 2015-09-03 NOTE — Telephone Encounter (Signed)
rx sent to pharmacy 08/31/15

## 2015-09-03 NOTE — Telephone Encounter (Signed)
Follow up       *STAT* If patient is at the pharmacy, call can be transferred to refill team.   1. Which medications need to be refilled? (please list name of each medication and dose if known) valsartan 160mg  2. Which pharmacy/location (including street and city if local pharmacy) is [facility] 3. Do they need a 30 day or 90 day supply? Need to be 90 day instead of 30 day.  Reference # [id number]

## 2015-09-04 ENCOUNTER — Other Ambulatory Visit: Payer: Self-pay | Admitting: Cardiovascular Disease

## 2015-09-04 NOTE — Telephone Encounter (Signed)
°*  STAT* If patient is at the pharmacy, call can be transferred to refill team.   1. Which medications need to be refilled? (please list name of each medication and dose if known) Valsartan  2. Which pharmacy/location (including street and city if local pharmacy) is medication to be sent to?[phone]  3. Do they need a 30 day or 90 day supply? 90 and refills

## 2015-09-07 NOTE — Telephone Encounter (Signed)
valsartan (DIOVAN) 160 MG tablet 90 tablet 3 08/31/2015    Sig - Route: Take 1 tablet (160 mg total) by mouth 2 (two) times daily. - Oral   Notes to Pharmacy: D/c 320 mg   E-Prescribing Status: Receipt confirmed by pharmacy (08/31/2015 12:08 PM EDT)   Pharmacy   [facility], [place]

## 2015-09-08 ENCOUNTER — Telehealth: Payer: Self-pay | Admitting: Pharmacist Clinician (PhC)/ Clinical Pharmacy Specialist

## 2015-09-08 ENCOUNTER — Other Ambulatory Visit: Payer: Self-pay | Admitting: Pharmacist Clinician (PhC)/ Clinical Pharmacy Specialist

## 2015-09-08 MED ORDER — VALSARTAN 160 MG PO TABS: 160.0000 mg | ORAL_TABLET | Freq: Two times a day (BID) | ORAL | 3 refills | Status: DC

## 2015-09-08 NOTE — Telephone Encounter (Signed)
Patient called concerned about her BP.  Last night was 98/63 in her left arm (111/66 in her right).  She had never had a systolic reading < 123XX123 and was worried.  She reports no lightheadedness, dizziness or other orthostatic concerns.  Explained that this is not a bad reading and as long as she is without symptoms she should not worry about this.  Advised to stay hydrated, keep checking her BP and keep her appointment with Dr. Oval Linsey for next week.

## 2015-09-09 ENCOUNTER — Other Ambulatory Visit: Payer: Self-pay | Admitting: Pharmacist Clinician (PhC)/ Clinical Pharmacy Specialist

## 2015-09-09 MED ORDER — VALSARTAN 160 MG PO TABS: 160.0000 mg | ORAL_TABLET | Freq: Two times a day (BID) | ORAL | 1 refills | Status: DC

## 2015-09-09 NOTE — Telephone Encounter (Signed)
Valsartan rx sent to both mail order and local HT pharmacies.

## 2015-09-14 ENCOUNTER — Encounter: Payer: Self-pay | Admitting: Cardiovascular Disease

## 2015-09-14 ENCOUNTER — Ambulatory Visit (INDEPENDENT_AMBULATORY_CARE_PROVIDER_SITE_OTHER): Payer: 59 | Admitting: Cardiovascular Disease

## 2015-09-14 VITALS — BP 138/78 | HR 58 | Ht 63.0 in | Wt 176.4 lb

## 2015-09-14 DIAGNOSIS — I493 Ventricular premature depolarization: Secondary | ICD-10-CM

## 2015-09-14 DIAGNOSIS — I10 Essential (primary) hypertension: Secondary | ICD-10-CM | POA: Diagnosis not present

## 2015-09-14 NOTE — Patient Instructions (Signed)
Medication Instructions:  START BYSTOLIC 20 MG DIALY  STOP CARVEDILOL   Labwork: NONE  Testing/Procedures: NONE  Follow-Up: Your physician wants you to follow-up in: [place] will receive a reminder letter in the mail two months in advance. If you don't receive a letter, please call our office to schedule the follow-up appointment.  Any Other Special Instructions Will Be Listed Below (If Applicable). AFTER 2 WEEKS ON THE BYSTOLIC IF YOU ARE DOING WELL CALL THE OFFICE AND WILL SEND A NEW RX TO THE PHARMACY   If you need a refill on your cardiac medications before your next appointment, please call your pharmacy.

## 2015-09-14 NOTE — Progress Notes (Signed)
Cardiology Office Note   Date:  09/14/2015   ID:  [name], DOB 04/26/1960, MRN [id number]  PCP:  Antony Blackbird, MD  Cardiologist:   Skeet Latch, MD   Chief Complaint  Patient presents with  . Follow-up    Pt states no Sx     History of Present Illness: Stephanie White is a 55 y.o. female with hypertension, hypokalemia, PVCs and hyperlipidemia who presents for follow up on palpitations and hypertension.  Stephanie White has noted palpitations for the last two years.  Her palpitations were felt to be due to PVCs in the setting of hypokalemia. Spironolactone was added to her medical regimen to help with both hypokalemia and blood pressure.  Serum renin and aldosterone levels were checked and were both normal.  She was referred for a treadmill stress test that revealed no ischemia but did have a hypertensive response to exercise.  She exercised for 7 minutes on a Bruce protocol, which is 9 METS. Her peak blood pressure was 163/100.  She developed lower extremity edema on amlodipine but has been able to tolerate 2.5 mg daily. She has been working with the hypertension clinic and reports that her BP has been very well-controlled lately.  She brings a log of her blood pressures that shows it has been mostly in the 110s-130s with occasional 90s and 140s.     [name] has a renal artery ultrasound that was negative for stenosis.  She is especially concerned about her blood pressure because her mother had a heart attack and died at age 78.  Her brother also has congestive heart failure.  Stephanie White denies chest pain, lower extremity edema, orthopnea, or PND.  She continues to have palpitations at times but they are much less bothersome than in the past.  Her main complaint is fatigue, which she attributes to her medications.   Past Medical History:  Diagnosis Date  . Allergic rhinitis   . Anemia   . HTN (hypertension)   . Hypokalemia   . Leukopenia  01/04/2012  . PVC (premature ventricular contraction) 06/23/2015    Past Surgical History:  Procedure Laterality Date  . CESAREAN SECTION     x2  . colonoscopy  2012   reportedly negative per patient  . ENDOMETRIAL ABLATION  12/2009     Current Outpatient Prescriptions  Medication Sig Dispense Refill  . amLODipine (NORVASC) 2.5 MG tablet Take 1 tablet (2.5 mg total) by mouth daily. 90 tablet 1  . Bacillus Coagulans-Inulin (PROBIOTIC FORMULA) 1-250 BILLION-MG CAPS Take 1 tablet by mouth daily.    . calcium-vitamin D (OSCAL WITH D) 500-200 MG-UNIT tablet Take 1 tablet by mouth daily with breakfast.    . Nebivolol HCl (BYSTOLIC) 20 MG TABS Take 20 mg by mouth daily.    Marland Kitchen spironolactone (ALDACTONE) 25 MG tablet Take 1 tablet (25 mg total) by mouth daily. 90 tablet 3  . valsartan (DIOVAN) 160 MG tablet Take 1 tablet (160 mg total) by mouth 2 (two) times daily. 60 tablet 1   No current facility-administered medications for this visit.     Allergies:   Amlodipine; Latex; and Tramadol    Social History:  The patient  reports that she has never smoked. She has never used smokeless tobacco. She reports that she does not drink alcohol or use drugs.   Family History:  The patient's family history includes Cancer in her paternal uncle; Cancer (age of onset: 12) in her father; Diabetes in her mother; Heart disease  in her brother, mother, and sister; Hypertension in her mother and sister.    ROS:  Please see the history of present illness.   Otherwise, review of systems are positive for none.   All other systems are reviewed and negative.    PHYSICAL EXAM: VS:  BP 138/78   Pulse (!) 58   Ht 5\' 3"  (1.6 m)   Wt 176 lb 6.4 oz (80 kg)   BMI 31.25 kg/m  , BMI Body mass index is 31.25 kg/m. GENERAL:  Well appearing HEENT:  Pupils equal round and reactive, fundi not visualized, oral mucosa unremarkable NECK:  No jugular venous distention, waveform within normal limits, carotid upstroke brisk  and symmetric, no bruits LYMPHATICS:  No cervical adenopathy LUNGS:  Clear to auscultation bilaterally HEART: Mostly regular with occasional ectopy.  PMI not displaced or sustained,S1 and S2 within normal limits, no S3, no S4, no clicks, no rubs, no murmurs ABD:  Flat, positive bowel sounds normal in frequency in pitch, no bruits, no rebound, no guarding, no midline pulsatile mass, no hepatomegaly, no splenomegaly EXT:  R radial pulses 2+, L radial 1+ no edema, no cyanosis no clubbing SKIN:  No rashes no nodules NEURO:  Cranial nerves II through XII grossly intact, motor grossly intact throughout PSYCH:  Cognitively intact, oriented to person place and time  EKG:  EKG is not ordered today. The ekg ordered 06/23/15 demonstrates sinus rhythm rate 62 bpm.     Recent Labs: 10/06/2014: Hemoglobin 13.0; Platelets 223 06/29/2015: Magnesium 2.1 08/17/2015: BUN 18; Creat 0.66; Potassium 4.0; Sodium 140   05/07/15 Sodium 141, potassium 3.3, BUN 13, creatinine 0.62 TSH 1.75 Total cholesterol 103, triglycerides 111, HDL 38, LDL 44  Lipid Panel No results found for: CHOL, TRIG, HDL, CHOLHDL, VLDL, LDLCALC, LDLDIRECT    Wt Readings from Last 3 Encounters:  09/14/15 176 lb 6.4 oz (80 kg)  08/06/15 184 lb (83.5 kg)  07/15/15 176 lb 9.6 oz (80.1 kg)      ASSESSMENT AND PLAN:  # Hypertension: # Hypokalemia: Stephanie White's BP is well-controlled today and has been at goal all month on her home monitoring.  She does note fatigue so we will try switching carvedilol to nebivolol 20 mg daily.  Continue amlodipine 2.5 mg daily, spironolactone 25 mg daily and valsartan 160 mg bid.  # PVCs:  PVCs persist but improved on carvedilol.  Switching to nebivolol due to palpitations as above.  # Atypical Chest Pain: ETT was negative for ischemia. I suspect that her chest pain is more stress and anxiety related.    Current medicines are reviewed at length with the patient today.  The patient does not have  concerns regarding medicines.  The following changes have been made: Switch carvedilol to nebivolol 20 mg daily.   Labs/ tests ordered today include:   No orders of the defined types were placed in this encounter.    Disposition:   FU with Sharron Simpson C. Oval Linsey, MD, [facility] in 6 months.    This note was written with the assistance of speech recognition software.  Please excuse any transcriptional errors.  Signed, Marico Buckle C. Oval Linsey, MD, [facility]  09/14/2015 4:50 PM    [facility]

## 2015-10-08 ENCOUNTER — Telehealth: Payer: Self-pay | Admitting: Cardiovascular Disease

## 2015-10-08 DIAGNOSIS — Z79899 Other long term (current) drug therapy: Secondary | ICD-10-CM

## 2015-10-08 MED ORDER — NEBIVOLOL HCL 20 MG PO TABS: 20.0000 mg | ORAL_TABLET | Freq: Every day | ORAL | 3 refills | Status: DC

## 2015-10-08 NOTE — Telephone Encounter (Signed)
OK to order BMP for drug monitoring.

## 2015-10-08 NOTE — Telephone Encounter (Signed)
New message     Patient calling wants to speak with nurse regarding appt she cancel out on  8.31 for blood pressure check.

## 2015-10-08 NOTE — Telephone Encounter (Signed)
Returned patient call-pt reports she cancelled BP check with Erasmo Downer because her BP has been running good (<140/90 per pt report and states she was told she could cancel if BP was doing well).  Pt also reports she would like to get her potassium level checked-reports it has ran low before and wants to make sure with current medication and medication changes that it is normal.  Pt also requesting samples for Bystolic 20mg  and for rx to be [facility] to [facility] for [facility] day supply.    Advised that I would route to MD for approval of lab work order as well as to inform her that current medication regimen is working well for her at this time.  Denies current s/sx and reports she is feeling well.  RX sent to pharmacy for 90 day supply.    Medication samples have been provided to the patient.  Drug name: Bystolic 20 mg  Qty: 28  LOT: [id number]  Exp.Date: 10/18  Samples left at front desk for patient pick-up. Patient notified.  Stephanie White A Rhen Kawecki 2:25 PM 10/08/2015

## 2015-10-09 NOTE — Addendum Note (Signed)
Addended by: Alvina Filbert B on: 10/09/2015 02:04 PM   Modules accepted: Orders

## 2015-10-09 NOTE — Telephone Encounter (Signed)
Lab ordered and scheduled at [facility] office, patient aware

## 2015-10-12 ENCOUNTER — Telehealth: Payer: Self-pay | Admitting: Cardiovascular Disease

## 2015-10-12 NOTE — Telephone Encounter (Signed)
Returned call to patient.She stated she wanted to know what pain medication she can take with her B/P medications.Stated she sits at a desk all day.She has pain in her hips and legs from sitting.Advised ok to take tylenol.Advised to get up often and walk around.

## 2015-10-12 NOTE — Telephone Encounter (Signed)
Patient would like to know what type of pain medication she can take for low back and hip pain.  She wants to be sure it does not interfere with the current medications.

## 2015-10-13 ENCOUNTER — Other Ambulatory Visit: Payer: 59

## 2015-10-13 ENCOUNTER — Other Ambulatory Visit: Payer: Self-pay | Admitting: Family Medicine

## 2015-10-13 DIAGNOSIS — E2839 Other primary ovarian failure: Secondary | ICD-10-CM

## 2015-10-15 ENCOUNTER — Ambulatory Visit: Payer: 59

## 2015-10-15 ENCOUNTER — Encounter: Payer: Self-pay | Admitting: Cardiovascular Disease

## 2015-10-15 ENCOUNTER — Telehealth: Payer: Self-pay | Admitting: Cardiovascular Disease

## 2015-10-15 DIAGNOSIS — I10 Essential (primary) hypertension: Secondary | ICD-10-CM

## 2015-10-15 DIAGNOSIS — Z79899 Other long term (current) drug therapy: Secondary | ICD-10-CM

## 2015-10-15 NOTE — Telephone Encounter (Signed)
New message     Pt calling to get confirmation about [facility] lab hrs since the number [place] gave her does not receive call. Please call.

## 2015-10-15 NOTE — Telephone Encounter (Signed)
Returned call to patient-patient wanted to verify that [place] on [address] is open 7a-5p on weekdays.  Reports the number she was given says the mailbox is full.    Will call lab to verify and let patient know.  Pt verbalized understanding.

## 2015-10-15 NOTE — Telephone Encounter (Signed)
Spoke with patient and she went to [facility] yesterday for labs instead of [facility] office. Have entered for [facility]. Patient requested lipid, cbc, and liver functions. Stated she had not had any of these checked recently

## 2015-10-16 LAB — COMPREHENSIVE METABOLIC PANEL
ALBUMIN: 4.2 g/dL (ref 3.6–5.1)
ALT: 21 U/L (ref 6–29)
AST: 19 U/L (ref 10–35)
Alkaline Phosphatase: 54 U/L (ref 33–130)
BUN: 13 mg/dL (ref 7–25)
CALCIUM: 9.6 mg/dL (ref 8.6–10.4)
CHLORIDE: 104 mmol/L (ref 98–110)
CO2: 28 mmol/L (ref 20–31)
Creat: 0.76 mg/dL (ref 0.50–1.05)
Glucose, Bld: 90 mg/dL (ref 65–99)
Potassium: 4.1 mmol/L (ref 3.5–5.3)
Sodium: 139 mmol/L (ref 135–146)
Total Bilirubin: 0.4 mg/dL (ref 0.2–1.2)
Total Protein: 7.1 g/dL (ref 6.1–8.1)

## 2015-10-16 LAB — CBC WITH DIFFERENTIAL/PLATELET
BASOS PCT: 0 %
Basophils Absolute: 0 cells/uL (ref 0–200)
EOS ABS: 64 {cells}/uL (ref 15–500)
Eosinophils Relative: 2 %
HEMATOCRIT: 37.5 % (ref 35.0–45.0)
Hemoglobin: 12.4 g/dL (ref 11.7–15.5)
Lymphocytes Relative: 42 %
Lymphs Abs: 1344 cells/uL (ref 850–3900)
MCH: 28.6 pg (ref 27.0–33.0)
MCHC: 33.1 g/dL (ref 32.0–36.0)
MCV: 86.6 fL (ref 80.0–100.0)
MPV: 11.6 fL (ref 7.5–12.5)
Monocytes Absolute: 352 cells/uL (ref 200–950)
Monocytes Relative: 11 %
NEUTROS ABS: 1440 {cells}/uL — AB (ref 1500–7800)
Neutrophils Relative %: 45 %
PLATELETS: 235 10*3/uL (ref 140–400)
RBC: 4.33 MIL/uL (ref 3.80–5.10)
RDW: 14.6 % (ref 11.0–15.0)
WBC: 3.2 10*3/uL — ABNORMAL LOW (ref 3.8–10.8)

## 2015-10-16 LAB — LIPID PANEL
CHOL/HDL RATIO: 3.8 ratio (ref <=5.0)
CHOLESTEROL: 153 mg/dL (ref 125–200)
HDL: 40 mg/dL — AB (ref >=46)
LDL Cholesterol: 83 mg/dL (ref <130)
Triglycerides: 151 mg/dL — ABNORMAL HIGH (ref <150)
VLDL: 30 mg/dL (ref <30)

## 2015-10-23 ENCOUNTER — Ambulatory Visit
Admission: RE | Admit: 2015-10-23 | Discharge: 2015-10-23 | Disposition: A | Discharge status: Home / Self Care | Payer: 59 | Source: Ambulatory Visit | Attending: Family Medicine | Admitting: Family Medicine

## 2015-10-23 DIAGNOSIS — E2839 Other primary ovarian failure: Secondary | ICD-10-CM

## 2015-12-03 ENCOUNTER — Telehealth: Payer: Self-pay | Admitting: Cardiovascular Disease

## 2015-12-03 NOTE — Telephone Encounter (Signed)
Spoke to patient. She notes she saw pcp last week, they noted concern bc her HR was 47-48 when checked. Pt notified them that this is typical for her. She notes it's usually upper 40s low 50s when she checks at home. Notes "Blood pressures have been good" as well, and she denies any current symptoms. Pt only has c/o dizziness sometimes during exercise, which she states occurs when she stands from a leaning over position.  Pt aware that I will inform Dr. Oval Linsey of her heart rate trends and see if any recommendation to adjust meds.  Pt voiced understanding, reassured me no rush on this. She is aware to call if new symptoms or concerns.

## 2015-12-03 NOTE — Telephone Encounter (Signed)
Pt went to her primary doctor on 11-27-15 and her doctor was concerned about low pulse rate

## 2015-12-10 NOTE — Telephone Encounter (Signed)
If she is feeling well, no need to change medications.  It is normal to feel dizzy when bending over if you are on BP medications.  Since this combinations has her BP under good control, I wouldn't change anything unless she has symptoms.

## 2015-12-11 NOTE — Telephone Encounter (Signed)
Left message to call back  

## 2015-12-11 NOTE — Telephone Encounter (Signed)
Follow up ° °Pt voiced returning nurses call. ° °Please f/u °

## 2015-12-18 NOTE — Telephone Encounter (Signed)
Spoke with patient and she states she is feeling good Patient will fax her readings to me on Monday for Dr Oval Linsey to review

## 2015-12-25 NOTE — Telephone Encounter (Signed)
Blood pressures reviewed by Dr Oval Linsey and no changes at this time Advised patient

## 2016-01-15 ENCOUNTER — Ambulatory Visit (INDEPENDENT_AMBULATORY_CARE_PROVIDER_SITE_OTHER): Payer: 59 | Admitting: Cardiovascular Disease

## 2016-01-15 ENCOUNTER — Telehealth: Payer: Self-pay | Admitting: Cardiovascular Disease

## 2016-01-15 ENCOUNTER — Encounter: Payer: Self-pay | Admitting: Cardiovascular Disease

## 2016-01-15 VITALS — BP 156/80 | HR 49 | Ht 63.0 in | Wt 183.8 lb

## 2016-01-15 DIAGNOSIS — R0789 Other chest pain: Secondary | ICD-10-CM

## 2016-01-15 DIAGNOSIS — I10 Essential (primary) hypertension: Secondary | ICD-10-CM

## 2016-01-15 DIAGNOSIS — I493 Ventricular premature depolarization: Secondary | ICD-10-CM | POA: Diagnosis not present

## 2016-01-15 DIAGNOSIS — F419 Anxiety disorder, unspecified: Secondary | ICD-10-CM

## 2016-01-15 NOTE — Telephone Encounter (Signed)
New message  Pt wants to give info  on bystolic card to Alvina Filbert  Please call back

## 2016-01-15 NOTE — Patient Instructions (Signed)

## 2016-01-15 NOTE — Telephone Encounter (Signed)
Spoke with patient regarding process with Bystolic card. She wanted to let me know with her particular insurance it was reimbursement, appreciated card

## 2016-01-15 NOTE — Progress Notes (Signed)
Cardiology Office Note   Date:  01/15/2016   ID:  Stephanie White, DOB 1960/09/02, MRN [id number]  PCP:  Antony Blackbird, MD  Cardiologist:   Skeet Latch, MD   Chief Complaint  Patient presents with  . Follow-up    6 months;     History of Present Illness: Stephanie White is a 55 y.o. female with hypertension, hypokalemia, PVCs and hyperlipidemia who presents for follow up.  Stephanie White has noted palpitations for the last two years.  Her palpitations were felt to be due to PVCs in the setting of hypokalemia. Spironolactone was added to her medical regimen to help with both hypokalemia and blood pressure.  Serum renin and aldosterone levels were checked and were both normal.  She was referred for a treadmill stress test 06/2015 that revealed no ischemia but did have a hypertensive response to exercise.  She exercised for 7 minutes on a Bruce protocol, which is 9 METS. Her peak blood pressure was 163/100.  She developed lower extremity edema on amlodipine but has been able to tolerate 2.5 mg daily.  Stephanie White has a renal artery ultrasound that was negative for stenosis.  She didn't take her BP medication today because she wasn't sure if she needed to fast.  Stephanie White has been doing well.  Her blood pressure was well-controlled until she had 30 people over her house for Thanksgiving.  Her mother in law was also recently diagnosed with a  Stroke.  She notes that it is always high when she feels stressed or anxious.  She acknowledges that her anxiety is not well-controlled.  She sometimes uses an over the counter medication called nerves, which helps.    Her palpitations have remained well-controlled.  Stephanie White denies chest pain, lower extremity edema, orthopnea, or PND.     Past Medical History:  Diagnosis Date  . Allergic rhinitis   . Anemia   . HTN (hypertension)   . Hypokalemia   . Leukopenia 01/04/2012  . PVC (premature ventricular  contraction) 06/23/2015    Past Surgical History:  Procedure Laterality Date  . CESAREAN SECTION     x2  . colonoscopy  2012   reportedly negative per patient  . ENDOMETRIAL ABLATION  12/2009     Current Outpatient Prescriptions  Medication Sig Dispense Refill  . amLODipine (NORVASC) 2.5 MG tablet Take 1 tablet (2.5 mg total) by mouth daily. 90 tablet 1  . Bacillus Coagulans-Inulin (PROBIOTIC FORMULA) 1-250 BILLION-MG CAPS Take 1 tablet by mouth daily.    . calcium-vitamin D (OSCAL WITH D) 500-200 MG-UNIT tablet Take 1 tablet by mouth daily with breakfast.    . Magnesium 500 MG CAPS Take by mouth.    . Nebivolol HCl (BYSTOLIC) 20 MG TABS Take 1 tablet (20 mg total) by mouth daily. 90 tablet 3  . spironolactone (ALDACTONE) 25 MG tablet Take 1 tablet (25 mg total) by mouth daily. 90 tablet 3  . Turmeric 500 MG TABS Take by mouth.    . valsartan (DIOVAN) 160 MG tablet Take 1 tablet (160 mg total) by mouth 2 (two) times daily. 60 tablet 1   No current facility-administered medications for this visit.     Allergies:   Amlodipine; Latex; and Tramadol    Social History:  The patient  reports that she has never smoked. She has never used smokeless tobacco. She reports that she does not drink alcohol or use drugs.   Family History:  The patient's family history includes Cancer in  her paternal uncle; Cancer (age of onset: 75) in her father; Diabetes in her mother; Heart disease in her brother, mother, and sister; Hypertension in her mother and sister.    ROS:  Please see the history of present illness.   Otherwise, review of systems are positive for none.   All other systems are reviewed and negative.    PHYSICAL EXAM: VS:  BP (!) 156/80   Pulse (!) 49   Ht 5\' 3"  (1.6 m)   Wt 83.4 kg (183 lb 12.8 oz)   BMI 32.56 kg/m  , BMI Body mass index is 32.56 kg/m. GENERAL:  Well appearing HEENT:  Pupils equal round and reactive, fundi not visualized, oral mucosa unremarkable NECK:  No  jugular venous distention, waveform within normal limits, carotid upstroke brisk and symmetric, no bruits LYMPHATICS:  No cervical adenopathy LUNGS:  Clear to auscultation bilaterally HEART: Mostly regular with occasional ectopy.  PMI not displaced or sustained,S1 and S2 within normal limits, no S3, no S4, no clicks, no rubs, no murmurs ABD:  Flat, positive bowel sounds normal in frequency in pitch, no bruits, no rebound, no guarding, no midline pulsatile mass, no hepatomegaly, no splenomegaly EXT:  R radial pulses 2+, L radial 1+ no edema, no cyanosis no clubbing SKIN:  No rashes no nodules NEURO:  Cranial nerves II through XII grossly intact, motor grossly intact throughout PSYCH:  Cognitively intact, oriented to person place and time  EKG:  EKG is ordered today. 01/15/16: Sinus bradycardia rate 49 bpm.  The ekg ordered 06/23/15 demonstrates sinus rhythm rate 62 bpm.     Recent Labs: 06/29/2015: Magnesium 2.1 10/16/2015: ALT 21; BUN 13; Creat 0.76; Hemoglobin 12.4; Platelets 235; Potassium 4.1; Sodium 139   05/07/15 Sodium 141, potassium 3.3, BUN 13, creatinine 0.62 TSH 1.75 Total cholesterol 103, triglycerides 111, HDL 38, LDL 44  Lipid Panel    Component Value Date/Time   CHOL 153 10/16/2015 0705   TRIG 151 (H) 10/16/2015 0705   HDL 40 (L) 10/16/2015 0705   CHOLHDL 3.8 10/16/2015 0705   VLDL 30 10/16/2015 0705   LDLCALC 83 10/16/2015 0705      Wt Readings from Last 3 Encounters:  01/15/16 83.4 kg (183 lb 12.8 oz)  09/14/15 80 kg (176 lb 6.4 oz)  08/06/15 83.5 kg (184 lb)      ASSESSMENT AND PLAN:  # Hypertension: # Hypokalemia: Stephanie White's BP is elevated today but well-controlled at home.  She hasn't taken her blood pressure medicine today.  Continue amlodipine, nebivolol, spironolactone and valsartan.  She is bradycardic but asymptomatic.   # PVCs:  PVCs persist but improved on nebivolol.   # Atypical Chest Pain: ETT was negative for ischemia. I suspect that  her chest pain is more stress and anxiety related.   # Anxiety: Stephanie White has significant anxiety.  Her blood pressure log shows that when she is feeling stressed or anxious her blood pressure is elevated. It is otherwise within normal limits. Therefore, I do not think that titrating her antihypertensives as the answer. We discussed starting an SSRI. She would like to think about this and will bring it up with her primary care provider X month.   Current medicines are reviewed at length with the patient today.  The patient does not have concerns regarding medicines.  The following changes have been made: none  Labs/ tests ordered today include:   No orders of the defined types were placed in this encounter.    Disposition:  FU with Xayla Puzio C. Oval Linsey, MD, [facility] in 6 months.    This note was written with the assistance of speech recognition software.  Please excuse any transcriptional errors.  Signed, Sayaka Hoeppner C. Oval Linsey, MD, [facility]  01/15/2016 11:05 AM    [place]

## 2016-03-11 ENCOUNTER — Other Ambulatory Visit: Payer: Self-pay | Admitting: Cardiovascular Disease

## 2016-03-11 MED ORDER — AMLODIPINE BESYLATE 2.5 MG PO TABS: 2.5000 mg | ORAL_TABLET | Freq: Every day | ORAL | 3 refills | Status: DC

## 2016-03-11 NOTE — Telephone Encounter (Signed)
New message    New pharmacy : [facility]   *STAT* If patient is at the pharmacy, call can be transferred to refill team.   1. Which medications need to be refilled? (please list name of each medication and dose if known) Almolditine 2.5mg  2. Which pharmacy/location (including street and city if local pharmacy) is medication to be sent to? [facility]  3. Do they need a 30 day or 90 day supply? [place]

## 2016-03-11 NOTE — Telephone Encounter (Signed)
Rx(s) sent to pharmacy electronically.  

## 2016-04-08 ENCOUNTER — Telehealth: Payer: Self-pay | Admitting: Cardiovascular Disease

## 2016-04-08 NOTE — Telephone Encounter (Signed)
Stephanie White is calling to speak to Stephanie White about a Weight watchers form. Please call

## 2016-04-08 NOTE — Telephone Encounter (Signed)
Received a call from patient requesting 2 things 1-form to be filled out for her flex spending account to cover weight watchers 2-if mandatory Saturday overtime is implemented at work requesting Dr Oval Linsey to do a letter for her not to have to participate. She is willing to work some overtime during the week and that is added stress but feels the weekend work would cause even more and increase her blood pressure   Discussed with Dr Oval Linsey and she is willing to do both if necessary  Patient will fax form for filling out

## 2016-04-08 NOTE — Telephone Encounter (Signed)
Left message to call back  

## 2016-04-13 NOTE — Telephone Encounter (Signed)
Form received, filled out, and signed by Dr Oval Linsey Mailed patient copy as requested, patient aware

## 2016-04-19 ENCOUNTER — Other Ambulatory Visit: Payer: Self-pay | Admitting: Obstetrics and Gynecology

## 2016-04-19 DIAGNOSIS — Z1231 Encounter for screening mammogram for malignant neoplasm of breast: Secondary | ICD-10-CM

## 2016-05-10 ENCOUNTER — Telehealth: Payer: Self-pay | Admitting: Cardiovascular Disease

## 2016-05-10 NOTE — Telephone Encounter (Signed)
New Message     On the Bystolic she only has 10 pills left do you have any samples?  Patient calling the office for samples of medication:   1.  What medication and dosage are you requesting samples for? Bystolic 20mg   2.  Are you currently out of this medication? 10 pills left she doesn't come in until April 19

## 2016-05-11 NOTE — Telephone Encounter (Signed)
Medication samples have been provided to the patient.  Drug name: Bystolic 20mg   Qty: 21  LOT: [id number]  Exp.Date: 12/19  Samples left at front desk for patient pick-up. Patient notified.  Rayshad Riviello A Armando Bukhari 3:14 PM 05/11/2016

## 2016-06-01 ENCOUNTER — Ambulatory Visit
Admission: RE | Admit: 2016-06-01 | Discharge: 2016-06-01 | Disposition: A | Discharge status: Home / Self Care | Payer: 59 | Source: Ambulatory Visit | Attending: Obstetrics and Gynecology | Admitting: Obstetrics and Gynecology

## 2016-06-01 DIAGNOSIS — Z1231 Encounter for screening mammogram for malignant neoplasm of breast: Secondary | ICD-10-CM

## 2016-06-02 ENCOUNTER — Ambulatory Visit (INDEPENDENT_AMBULATORY_CARE_PROVIDER_SITE_OTHER): Payer: 59 | Admitting: Cardiovascular Disease

## 2016-06-02 ENCOUNTER — Encounter: Payer: Self-pay | Admitting: Cardiovascular Disease

## 2016-06-02 VITALS — BP 170/92 | HR 56 | Ht 63.0 in | Wt 180.0 lb

## 2016-06-02 DIAGNOSIS — R0789 Other chest pain: Secondary | ICD-10-CM | POA: Diagnosis not present

## 2016-06-02 DIAGNOSIS — I10 Essential (primary) hypertension: Secondary | ICD-10-CM

## 2016-06-02 DIAGNOSIS — I493 Ventricular premature depolarization: Secondary | ICD-10-CM

## 2016-06-02 MED ORDER — NEBIVOLOL HCL 20 MG PO TABS: 20.0000 mg | ORAL_TABLET | Freq: Every day | ORAL | 3 refills | Status: DC

## 2016-06-02 NOTE — Patient Instructions (Signed)

## 2016-06-02 NOTE — Progress Notes (Signed)
Cardiology Office Note   Date:  06/02/2016   ID:  Kearie Mennen, DOB 1961/01/12, MRN [id number]  PCP:  Antony Blackbird, MD  Cardiologist:   Skeet Latch, MD   Chief Complaint  Patient presents with  . Follow-up  . Headache     History of Present Illness: Stephanie White is a 56 y.o. female with hypertension, hypokalemia, PVCs and hyperlipidemia who presents for follow up.  Ms. Saville has noted palpitations for the last two years.  Her palpitations were felt to be due to PVCs in the setting of hypokalemia. Spironolactone was added to her medical regimen to help with both hypokalemia and blood pressure.  Serum renin and aldosterone levels were checked and were both normal.  She was referred for a treadmill stress test 06/2015 that revealed no ischemia but did have a hypertensive response to exercise.  She exercised for 7 minutes on a Bruce protocol, which is 9 METS. Her peak blood pressure was 163/100.  She developed lower extremity edema on amlodipine but has been able to tolerate 2.5 mg daily.  Ms. Hauk has a renal artery ultrasound that was negative for stenosis.    Since her last appointment [name] has been doing well.  She continues to exercise 3-4 times per week.  Several co-workers and her boss have all been experiencing adverse health consequences so now the company is trying to reduce their stress levels.  Her blood pressure at home has been pretty good. She continues to exercise 3-4 times per week.  She has no chest pain or shortness of breath with this activity. She likes to walk and also does the elliptical. She's lost 8.8 pounds in the last 2 months and is using Weight Watchers at her job.   Past Medical History:  Diagnosis Date  . Allergic rhinitis   . Anemia   . HTN (hypertension)   . Hypokalemia   . Leukopenia 01/04/2012  . PVC (premature ventricular contraction) 06/23/2015    Past Surgical History:  Procedure Laterality Date  .  BREAST BIOPSY Left 2016  . CESAREAN SECTION     x2  . colonoscopy  2012   reportedly negative per patient  . ENDOMETRIAL ABLATION  12/2009     Current Outpatient Prescriptions  Medication Sig Dispense Refill  . amLODipine (NORVASC) 2.5 MG tablet Take 1 tablet (2.5 mg total) by mouth daily. 90 tablet 3  . Bacillus Coagulans-Inulin (PROBIOTIC FORMULA) 1-250 BILLION-MG CAPS Take 1 tablet by mouth daily.    . calcium-vitamin D (OSCAL WITH D) 500-200 MG-UNIT tablet Take 1 tablet by mouth daily with breakfast.    . Magnesium 500 MG CAPS Take by mouth.    . Nebivolol HCl (BYSTOLIC) 20 MG TABS Take 1 tablet (20 mg total) by mouth daily. 90 tablet 3  . spironolactone (ALDACTONE) 25 MG tablet Take 1 tablet (25 mg total) by mouth daily. 90 tablet 3  . Turmeric 500 MG TABS Take by mouth.    . valsartan (DIOVAN) 160 MG tablet Take 1 tablet (160 mg total) by mouth 2 (two) times daily. 60 tablet 1   No current facility-administered medications for this visit.     Allergies:   Amlodipine; Latex; and Tramadol     Social History:  The patient  reports that she has never smoked. She has never used smokeless tobacco. She reports that she does not drink alcohol or use drugs.   Family History:  The patient's family history includes Cancer in her paternal uncle;  Cancer (age of onset: 95) in her father; Diabetes in her mother; Heart disease in her brother, mother, and sister; Hypertension in her mother and sister.    ROS:  Please see the history of present illness.   Otherwise, review of systems are positive for none.   All other systems are reviewed and negative.    PHYSICAL EXAM: VS:  BP (!) 170/92   Pulse (!) 56   Ht 5\' 3"  (1.6 m)   Wt 81.6 kg (180 lb)   BMI 31.89 kg/m  , BMI Body mass index is 31.89 kg/m. GENERAL:  Well appearing.  No acute distress HEENT:  Pupils equal round and reactive, fundi not visualized, oral mucosa unremarkable NECK:  No jugular venous distention, waveform within  normal limits, carotid upstroke brisk and symmetric, no bruits LYMPHATICS:  No cervical adenopathy LUNGS:  Clear to auscultation bilaterally HEART: Mostly regular with occasional ectopy.  PMI not displaced or sustained,S1 and S2 within normal limits, no S3, no S4, no clicks, no rubs, no murmurs ABD:  Flat, positive bowel sounds normal in frequency in pitch, no bruits, no rebound, no guarding, no midline pulsatile mass, no hepatomegaly, no splenomegaly EXT:  2+ radial pulses, no edema, no cyanosis no clubbing SKIN:  No rashes no nodules NEURO:  Cranial nerves II through XII grossly intact, motor grossly intact throughout PSYCH:  Cognitively intact, oriented to person place and time  EKG:  EKG is ordered today. 01/15/16: Sinus bradycardia rate 49 bpm.  The ekg ordered 06/23/15 demonstrates sinus rhythm rate 62 bpm.     Recent Labs: 06/29/2015: Magnesium 2.1 10/16/2015: ALT 21; BUN 13; Creat 0.76; Hemoglobin 12.4; Platelets 235; Potassium 4.1; Sodium 139   05/07/15 Sodium 141, potassium 3.3, BUN 13, creatinine 0.62 TSH 1.75 Total cholesterol 103, triglycerides 111, HDL 38, LDL 44  Lipid Panel    Component Value Date/Time   CHOL 153 10/16/2015 0705   TRIG 151 (H) 10/16/2015 0705   HDL 40 (L) 10/16/2015 0705   CHOLHDL 3.8 10/16/2015 0705   VLDL 30 10/16/2015 0705   LDLCALC 83 10/16/2015 0705      Wt Readings from Last 3 Encounters:  06/02/16 81.6 kg (180 lb)  01/15/16 83.4 kg (183 lb 12.8 oz)  09/14/15 80 kg (176 lb 6.4 oz)      ASSESSMENT AND PLAN:  # Hypertension: # Hypokalemia: Her blood pressure is elevated today in clinic but she brings a log of her home blood pressures that she has been well controlled. No changes at this time.  Continue labetalol, amlodipine valsartan, and spironolactone   # PVCs:  PVCs much better-controlled on nebivolol. She has noted that this is highly correlated with anxiety and stress at work.  # Atypical Chest Pain: ETT was negative for ischemia.   Resolved.   Current medicines are reviewed at length with the patient today.  The patient does not have concerns regarding medicines.  The following changes have been made: none  Labs/ tests ordered today include:   No orders of the defined types were placed in this encounter.   Disposition:   FU with Cashius Grandstaff C. Oval Linsey, MD, [facility] in 6 months.    This note was written with the assistance of speech recognition software.  Please excuse any transcriptional errors.  Signed, Talayah Picardi C. Oval Linsey, MD, [facility]  06/02/2016 12:52 PM    [facility]

## 2016-07-07 DIAGNOSIS — I10 Essential (primary) hypertension: Secondary | ICD-10-CM | POA: Diagnosis not present

## 2016-07-07 DIAGNOSIS — E785 Hyperlipidemia, unspecified: Secondary | ICD-10-CM | POA: Diagnosis not present

## 2016-07-13 ENCOUNTER — Ambulatory Visit: Payer: 59 | Admitting: Cardiovascular Disease

## 2016-09-07 ENCOUNTER — Telehealth: Payer: Self-pay | Admitting: Cardiovascular Disease

## 2016-09-07 MED ORDER — IRBESARTAN 75 MG PO TABS: 75.0000 mg | ORAL_TABLET | Freq: Every day | ORAL | 5 refills | Status: DC

## 2016-09-07 NOTE — Telephone Encounter (Signed)
Pt c/o medication issue: ° °1. Name of Medication: Valsartan ° °2. How are you currently taking this medication (dosage and times per day)?  ° °3. Are you having a reaction (difficulty breathing--STAT)?  ° °4. What is your medication issue?  ° °

## 2016-09-07 NOTE — Telephone Encounter (Signed)
Spoke with patient regarding Valsartan and recall  Patient has lost 20 pounds and would like to try to come off a medication. Systolic blood pressures have been running in the 120's. Discussed with Claiborne Billings D and ok to try the Irbesartan 75 mg daily and monitor blood pressure. If remains in the 120's after a few weeks would be reasonable to try without  Advised patient. She will cal back if any changes in her blood pressure

## 2016-09-19 ENCOUNTER — Other Ambulatory Visit: Payer: Self-pay | Admitting: *Deleted

## 2016-09-19 DIAGNOSIS — Z01419 Encounter for gynecological examination (general) (routine) without abnormal findings: Secondary | ICD-10-CM | POA: Diagnosis not present

## 2016-09-19 MED ORDER — SPIRONOLACTONE 25 MG PO TABS: 25.0000 mg | ORAL_TABLET | Freq: Every day | ORAL | 3 refills | Status: DC

## 2016-09-26 ENCOUNTER — Other Ambulatory Visit: Payer: Self-pay | Admitting: Cardiovascular Disease

## 2016-09-26 MED ORDER — SPIRONOLACTONE 25 MG PO TABS: 25.0000 mg | ORAL_TABLET | Freq: Every day | ORAL | 3 refills | Status: DC

## 2016-09-26 NOTE — Telephone Encounter (Signed)
Rx sent to pharmacy.Patient directly notified.

## 2016-09-26 NOTE — Telephone Encounter (Signed)
New message       *STAT* If patient is at the pharmacy, call can be transferred to refill team.   1. Which medications need to be refilled? (please list name of each medication and dose if known)  Spironolactone 25mg   2. Which pharmacy/location (including street and city if local pharmacy) is [facility] sent to?[facility] 3. Do they need a 30 day or 90 day supply? 90 day

## 2016-10-10 ENCOUNTER — Telehealth: Payer: Self-pay | Admitting: Cardiovascular Disease

## 2016-10-10 NOTE — Telephone Encounter (Signed)
Left message for pt to call.

## 2016-10-10 NOTE — Telephone Encounter (Signed)
Returning your call. °

## 2016-10-10 NOTE — Telephone Encounter (Signed)
Spoke with pt, the change to irbesartan has made her feel tired and lethargic. She reports her bp has been normal. She was given the okay to stop the irbesartan, she will track her bp and let us know if running high. See previous note from pharm md.

## 2016-10-10 NOTE — Telephone Encounter (Signed)
New message    Pt is calling. She said the trail pills she is on, she is feeling the side effects of it.   Pt c/o medication issue:  1. Name of Medication: irbesartan 75 mg  2. How are you currently taking this medication (dosage and times per day)? Once daily  3. Are you having a reaction (difficulty breathing--STAT)?     4. What is your medication issue? Pt is feeling the side effects and wants to talk to the RN about this. Please call.

## 2016-10-14 DIAGNOSIS — N870 Mild cervical dysplasia: Secondary | ICD-10-CM | POA: Diagnosis not present

## 2016-10-14 DIAGNOSIS — R87618 Other abnormal cytological findings on specimens from cervix uteri: Secondary | ICD-10-CM | POA: Diagnosis not present

## 2016-10-14 DIAGNOSIS — N879 Dysplasia of cervix uteri, unspecified: Secondary | ICD-10-CM | POA: Diagnosis not present

## 2016-10-27 DIAGNOSIS — R350 Frequency of micturition: Secondary | ICD-10-CM | POA: Diagnosis not present

## 2016-10-27 DIAGNOSIS — Z23 Encounter for immunization: Secondary | ICD-10-CM | POA: Diagnosis not present

## 2016-11-22 NOTE — Progress Notes (Signed)
Cardiology Office Note   Date:  11/28/2016   ID:  Stephanie White, DOB 01-Feb-1961, MRN [id number]  PCP:  Lennie Odor, PA-C  Cardiologist:   Skeet Latch, MD   Chief Complaint  Patient presents with  . Follow-up     History of Present Illness: Stephanie White is a 56 y.o. female with hypertension, hypokalemia, PVCs and hyperlipidemia who presents for follow up.  Ms. Lacks has noted palpitations for the last two years.  Her palpitations were felt to be due to PVCs in the setting of hypokalemia. Spironolactone was added to her medical regimen to help with both hypokalemia and blood pressure.  Serum renin and aldosterone levels were checked and were both normal.  She was referred for a treadmill stress test 06/2015 that revealed no ischemia but she did have a hypertensive response to exercise.  She exercised for 7 minutes on a Bruce protocol, which is 9 METS. Her peak blood pressure was 163/100.  She developed lower extremity edema on amlodipine but has been able to tolerate 2.5 mg daily.  [name] has a renal artery ultrasound that was negative for stenosis.    At her last appointment valsartan was switched to irbesartan due to the recall.  She developed lethergy and stopped the medication. She continues to check her blood pressures and they have been mostly in the 120s to 130s with occasional episodes in the 140s. She notes that her blood pressure typically higher on days when she has not been exercising. Since her last appointment she has been very physically active and exercising with intermittent videos. She is also participating in the weight watcher's program through work. In the last 6 months she has lost 21 pounds. She reports feeling well when she exercises. She has no chest pain or shortness of breath. She has not experienced any lower extremity edema. 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 She thinks that is why her blood  pressure is elevated today. She reports some episodes of weakness in her legs after exercising. She is unable to do some yoga moves when her head is down. She also gets some mild lightheadedness when walking upstairs. Her PCP is concerned that her heart rate may be too low.    Past Medical History:  Diagnosis Date  . Allergic rhinitis   . Anemia   . HTN (hypertension)   . Hypokalemia   . Leukopenia 01/04/2012  . PVC (premature ventricular contraction) 06/23/2015    Past Surgical History:  Procedure Laterality Date  . BREAST BIOPSY Left 2016  . CESAREAN SECTION     x2  . colonoscopy  2012   reportedly negative per patient  . ENDOMETRIAL ABLATION  12/2009     Current Outpatient Prescriptions  Medication Sig Dispense Refill  . amLODipine (NORVASC) 2.5 MG tablet Take 1 tablet (2.5 mg total) by mouth daily. 90 tablet 3  . Bacillus Coagulans-Inulin (PROBIOTIC FORMULA) 1-250 BILLION-MG CAPS Take 1 tablet by mouth daily.    . calcium-vitamin D (OSCAL WITH D) 500-200 MG-UNIT tablet Take 1 tablet by mouth daily with breakfast.    . Magnesium 500 MG CAPS Take by mouth.    . spironolactone (ALDACTONE) 25 MG tablet Take 1 tablet (25 mg total) by mouth daily. 90 tablet 3  . Turmeric 500 MG TABS Take by mouth.    Marland Kitchen lisinopril (PRINIVIL,ZESTRIL) 10 MG tablet Take 1 tablet (10 mg total) by mouth daily. 90 tablet 1  . nebivolol (BYSTOLIC)  10 MG tablet Take 1 tablet (10 mg total) by mouth daily. 90 tablet 3   No current facility-administered medications for this visit.     Allergies:   Amlodipine; Latex; and Tramadol     Social History:  The patient  reports that she has never smoked. She has never used smokeless tobacco. She reports that she does not drink alcohol or use drugs.   Family History:  The patient's family history includes Cancer in her paternal uncle; Cancer (age of onset: 25) in her father; Diabetes in her mother; Heart disease in her brother, mother, and sister; Hypertension in  her mother and sister.    ROS:  Please see the history of present illness.   Otherwise, review of systems are positive for none.   All other systems are reviewed and negative.    PHYSICAL EXAM: VS:  BP (!) 148/76   Pulse (!) 49   Ht 5\' 3"  (1.6 m)   Wt 72.1 kg (159 lb)   BMI 28.17 kg/m  , BMI Body mass index is 28.17 kg/m. GENERAL:  Well appearing HEENT: Pupils equal round and reactive, fundi not visualized, oral mucosa unremarkable NECK:  No jugular venous distention, waveform within normal limits, carotid upstroke brisk and symmetric, no bruits, no thyromegaly LUNGS:  Clear to auscultation bilaterally HEART:  RRR.  PMI not displaced or sustained,S1 and S2 within normal limits, no S3, no S4, no clicks, no rubs, no murmurs ABD:  Flat, positive bowel sounds normal in frequency in pitch, no bruits, no rebound, no guarding, no midline pulsatile mass, no hepatomegaly, no splenomegaly EXT:  2 plus pulses throughout, no edema, no cyanosis no clubbing SKIN:  No rashes no nodules NEURO:  Cranial nerves II through XII grossly intact, motor grossly intact throughout PSYCH:  Cognitively intact, oriented to person place and time  EKG:  EKG is ordered today. 01/15/16: Sinus bradycardia rate 49 bpm.  The ekg ordered 06/23/15 demonstrates sinus rhythm rate 62 bpm.   11/28/2013: Sinus bradycardia. Rate 49 bpm. Left anterior fascicular block. Nonspecific ST/T changes.    Recent Labs: No results found for requested labs within last 8760 hours.   05/07/15 Sodium 141, potassium 3.3, BUN 13, creatinine 0.62 TSH 1.75 Total cholesterol 103, triglycerides 111, HDL 38, LDL 44  Lipid Panel    Component Value Date/Time   CHOL 153 10/16/2015 0705   TRIG 151 (H) 10/16/2015 0705   HDL 40 (L) 10/16/2015 0705   CHOLHDL 3.8 10/16/2015 0705   VLDL 30 10/16/2015 0705   LDLCALC 83 10/16/2015 0705      Wt Readings from Last 3 Encounters:  11/28/16 72.1 kg (159 lb)  06/02/16 81.6 kg (180 lb)  01/15/16  83.4 kg (183 lb 12.8 oz)      ASSESSMENT AND PLAN:  # Hypertension: # Hypokalemia: Blood pressure is above goal today. This is typically high when she is in the doctor's office and she has been feeling stressed lately. Her blood pressure has been fairly well-controlled at home. She has symptomatic bradycardia so we will reduce it to 10mg .  Add lisinopril 10 mg daily. She will continue her blood pressures. If they remain greater than 130/80 and she can increase lisinopril to 20 mg daily. She will return for a basic metabolic panel next week.  Continue spironolactone.   # PVCs:  PVCs much better-controlled on nebivolol. However she has symptomatically bradycardia. We will reduce atenolol to 10 mg daily.   # Atypical Chest Pain: ETT was negative for  ischemia.  Resolved.   Current medicines are reviewed at length with the patient today.  The patient does not have concerns regarding medicines.  The following changes have been made: none  Labs/ tests ordered today include:   Orders Placed This Encounter  Procedures  . Basic metabolic panel  . EKG 12-Lead    Disposition:   FU with Eleesha Purkey C. Oval Linsey, MD, [facility] in 6 months.  APP or PharmD in 1 month.   This note was written with the assistance of speech recognition software.  Please excuse any transcriptional errors.  Signed, Aspen Deterding C. Oval Linsey, MD, [facility]  11/28/2016 8:58 AM    [facility]

## 2016-11-24 DIAGNOSIS — R300 Dysuria: Secondary | ICD-10-CM | POA: Diagnosis not present

## 2016-11-24 DIAGNOSIS — N949 Unspecified condition associated with female genital organs and menstrual cycle: Secondary | ICD-10-CM | POA: Diagnosis not present

## 2016-11-28 ENCOUNTER — Encounter: Payer: Self-pay | Admitting: Cardiovascular Disease

## 2016-11-28 ENCOUNTER — Ambulatory Visit (INDEPENDENT_AMBULATORY_CARE_PROVIDER_SITE_OTHER): Payer: 59 | Admitting: Cardiovascular Disease

## 2016-11-28 VITALS — BP 148/76 | HR 49 | Ht 63.0 in | Wt 159.0 lb

## 2016-11-28 DIAGNOSIS — I10 Essential (primary) hypertension: Secondary | ICD-10-CM

## 2016-11-28 DIAGNOSIS — Z5181 Encounter for therapeutic drug level monitoring: Secondary | ICD-10-CM

## 2016-11-28 DIAGNOSIS — I493 Ventricular premature depolarization: Secondary | ICD-10-CM | POA: Diagnosis not present

## 2016-11-28 MED ORDER — LISINOPRIL 20 MG PO TABS: 20.0000 mg | ORAL_TABLET | Freq: Every day | ORAL | 1 refills | Status: DC

## 2016-11-28 MED ORDER — LISINOPRIL 10 MG PO TABS: 10.0000 mg | ORAL_TABLET | Freq: Every day | ORAL | 1 refills | Status: DC

## 2016-11-28 MED ORDER — NEBIVOLOL HCL 10 MG PO TABS: 10.0000 mg | ORAL_TABLET | Freq: Every day | ORAL | 3 refills | Status: DC

## 2016-11-28 NOTE — Patient Instructions (Addendum)
Medication Instructions:  DECREASE YOUR BYSTOLIC TO 10 MG DAILY   START LISINOPRIL 10 MG DAILY   Labwork: BMET IN 1 WEEK  Testing/Procedures: NONE  Follow-Up: Your physician recommends that you schedule a follow-up appointment in: 1 MONTH OV  Any Other Special Instructions Will Be Listed Below (If Applicable). CONTINUE TO MONITOR YOUR BLOOD PRESSURE AT HOME IF IT REMAINS GREATER THAN 130/80 INCREASE YOUR LISINOPRIL TO 20 MG DAILY   If you need a refill on your cardiac medications before your next appointment, please call your pharmacy.

## 2016-12-05 ENCOUNTER — Telehealth: Payer: Self-pay | Admitting: Cardiovascular Disease

## 2016-12-05 NOTE — Telephone Encounter (Signed)
Spoke with patient regarding EKG, no further questions. Patient also wanted to know if office had received anything from insurance company, had to reapply for life insurance. Advised I had not seen anything but would follow up with medical records tomorrow and call her back

## 2016-12-05 NOTE — Telephone Encounter (Signed)
New message    Patient calling for EKG results. Please call

## 2016-12-06 NOTE — Telephone Encounter (Signed)
Advised patient nothing received per medical records department. Advised to contact insurance company and ask for them to be refaxed to [phone]

## 2016-12-12 DIAGNOSIS — Z5181 Encounter for therapeutic drug level monitoring: Secondary | ICD-10-CM | POA: Diagnosis not present

## 2016-12-12 DIAGNOSIS — I10 Essential (primary) hypertension: Secondary | ICD-10-CM | POA: Diagnosis not present

## 2016-12-13 LAB — BASIC METABOLIC PANEL
BUN / CREAT RATIO: 26 — AB (ref 9–23)
BUN: 19 mg/dL (ref 6–24)
CHLORIDE: 100 mmol/L (ref 96–106)
CO2: 27 mmol/L (ref 20–29)
Calcium: 9.3 mg/dL (ref 8.7–10.2)
Creatinine, Ser: 0.73 mg/dL (ref 0.57–1.00)
GFR, EST AFRICAN AMERICAN: 106 mL/min/{1.73_m2} (ref >59)
GFR, EST NON AFRICAN AMERICAN: 92 mL/min/{1.73_m2} (ref >59)
Glucose: 82 mg/dL (ref 65–99)
POTASSIUM: 4 mmol/L (ref 3.5–5.2)
Sodium: 139 mmol/L (ref 134–144)

## 2016-12-16 ENCOUNTER — Telehealth: Payer: Self-pay | Admitting: Cardiovascular Disease

## 2016-12-16 ENCOUNTER — Other Ambulatory Visit: Payer: Self-pay

## 2016-12-16 MED ORDER — LISINOPRIL 20 MG PO TABS: 20.0000 mg | ORAL_TABLET | Freq: Every day | ORAL | 6 refills | Status: DC

## 2016-12-16 NOTE — Telephone Encounter (Signed)
Stephanie White is calling because she is wanting to go over her medication . Please call

## 2016-12-16 NOTE — Telephone Encounter (Signed)
Returned call to patient no answer.LMTC. 

## 2016-12-16 NOTE — Telephone Encounter (Signed)
Received call back from patient.She stated she increased Lisinopril to 20 mg daily due to systolic B/P 451.Stated she needs Bystolic 10 mg samples until she sees Dr.[place] later this month.Bystolic 10 mg samples left at front desk for pick up.

## 2016-12-21 DIAGNOSIS — I10 Essential (primary) hypertension: Secondary | ICD-10-CM | POA: Diagnosis not present

## 2016-12-21 DIAGNOSIS — Z0000 Encounter for general adult medical examination without abnormal findings: Secondary | ICD-10-CM | POA: Diagnosis not present

## 2017-01-10 ENCOUNTER — Ambulatory Visit (INDEPENDENT_AMBULATORY_CARE_PROVIDER_SITE_OTHER): Payer: 59 | Admitting: Cardiovascular Disease

## 2017-01-10 ENCOUNTER — Encounter: Payer: Self-pay | Admitting: Cardiovascular Disease

## 2017-01-10 VITALS — BP 170/84 | HR 51 | Ht 63.0 in | Wt 159.0 lb

## 2017-01-10 DIAGNOSIS — I493 Ventricular premature depolarization: Secondary | ICD-10-CM

## 2017-01-10 DIAGNOSIS — I10 Essential (primary) hypertension: Secondary | ICD-10-CM

## 2017-01-10 MED ORDER — DOXAZOSIN MESYLATE 2 MG PO TABS: 2.0000 mg | ORAL_TABLET | Freq: Every day | ORAL | 1 refills | Status: DC

## 2017-01-10 NOTE — Progress Notes (Signed)
Cardiology Office Note   Date:  01/10/2017   ID:  Stephanie White, DOB 15-May-1960, MRN [id number]  PCP:  Lennie Odor, PA-C  Cardiologist:   Skeet Latch, MD   No chief complaint on file.    History of Present Illness: Stephanie White is a 56 y.o. female with hypertension, hypokalemia, PVCs and hyperlipidemia who presents for follow up.  Stephanie White has noted palpitations for the last two years.  Her palpitations were felt to be due to PVCs in the setting of hypokalemia. Spironolactone was added to her medical regimen to help with both hypokalemia and blood pressure.  Serum renin and aldosterone levels were checked and were both normal.  She was referred for a treadmill stress test 06/2015 that revealed no ischemia but she did have a hypertensive response to exercise.  She exercised for 7 minutes on a Bruce protocol, which is 9 METS. Her peak blood pressure was 163/100.  She developed lower extremity edema on amlodipine but has been able to tolerate 2.5 mg daily.  Stephanie White has a renal artery ultrasound that was negative for stenosis.    At a previous appointment valsartan was switched to irbesartan due to the recall.  She developed lethergy and stopped the irbesartan.  At her last appointment nebivolol was reduced 2/2 bradycardia.  She was started on lisinopril instead.  She notes headaches and her legs feel weak after exercising since making this change.  She has been feeling well but her BP has been elevated since Thanksgiving.  Her BP is mostly in the 130s-140s and occasionally 150s.  She wonders if she is a candidate for [employer].  She denies chest pain or shortness of breath and continues to exercise regularly.  Stephanie White denies lower extremity edema, orthopnea, or PND.   Past Medical History:  Diagnosis Date  . Allergic rhinitis   . Anemia   . HTN (hypertension)   . Hypokalemia   . Leukopenia 01/04/2012  . PVC (premature ventricular  contraction) 06/23/2015    Past Surgical History:  Procedure Laterality Date  . BREAST BIOPSY Left 2016  . CESAREAN SECTION     x2  . colonoscopy  2012   reportedly negative per patient  . ENDOMETRIAL ABLATION  12/2009     Current Outpatient Medications  Medication Sig Dispense Refill  . amLODipine (NORVASC) 2.5 MG tablet Take 1 tablet (2.5 mg total) by mouth daily. 90 tablet 3  . Bacillus Coagulans-Inulin (PROBIOTIC FORMULA) 1-250 BILLION-MG CAPS Take 1 tablet by mouth daily.    . Multiple Vitamins-Minerals (ALIVE WOMENS 50+ PO) Take 1 tablet by mouth daily.    . nebivolol (BYSTOLIC) 10 MG tablet Take 1 tablet (10 mg total) by mouth daily. 90 tablet 3  . Omega-3 Fatty Acids (FISH OIL BURP-LESS PO) Take 1,200 mg by mouth daily.    Marland Kitchen OVER THE COUNTER MEDICATION Take 1 capsule by mouth daily.    Marland Kitchen spironolactone (ALDACTONE) 25 MG tablet Take 1 tablet (25 mg total) by mouth daily. 90 tablet 3  . Turmeric 500 MG TABS Take by mouth.    . doxazosin (CARDURA) 2 MG tablet Take 1 tablet (2 mg total) by mouth daily. 90 tablet 1   No current facility-administered medications for this visit.     Allergies:   Amlodipine; Latex; and Tramadol     Social History:  The patient  reports that  has never smoked. she has never used smokeless tobacco. She reports that she does not drink alcohol or  use drugs.   Family History:  The patient's family history includes Cancer in her paternal uncle; Cancer (age of onset: 45) in her father; Diabetes in her mother; Heart disease in her brother, mother, and sister; Hypertension in her mother and sister.    ROS:  Please see the history of present illness.   Otherwise, review of systems are positive for none.   All other systems are reviewed and negative.    PHYSICAL EXAM: VS:  BP (!) 170/84   Pulse (!) 51   Ht 5\' 3"  (1.6 m)   Wt 159 lb (72.1 kg)   BMI 28.17 kg/m  , BMI Body mass index is 28.17 kg/m. GENERAL:  Well appearing HEENT: Pupils equal round  and reactive, fundi not visualized, oral mucosa unremarkable NECK:  No jugular venous distention, waveform within normal limits, carotid upstroke brisk and symmetric, no bruits, no thyromegaly LUNGS:  Clear to auscultation bilaterally HEART:  RRR.  PMI not displaced or sustained,S1 and S2 within normal limits, no S3, no S4, no clicks, no rubs, no murmurs ABD:  Flat, positive bowel sounds normal in frequency in pitch, no bruits, no rebound, no guarding, no midline pulsatile mass, no hepatomegaly, no splenomegaly EXT:  2 plus pulses throughout, no edema, no cyanosis no clubbing SKIN:  No rashes no nodules NEURO:  Cranial nerves II through XII grossly intact, motor grossly intact throughout PSYCH:  Cognitively intact, oriented to person place and time  EKG:  EKG is not ordered today. 01/15/16: Sinus bradycardia rate 49 bpm.  The ekg ordered 06/23/15 demonstrates sinus rhythm rate 62 bpm.   11/28/2013: Sinus bradycardia. Rate 49 bpm. Left anterior fascicular block. Nonspecific ST/T changes.    Recent Labs: 12/12/2016: BUN 19; Creatinine, Ser 0.73; Potassium 4.0; Sodium 139   05/07/15 Sodium 141, potassium 3.3, BUN 13, creatinine 0.62 TSH 1.75 Total cholesterol 103, triglycerides 111, HDL 38, LDL 44  Lipid Panel    Component Value Date/Time   CHOL 153 10/16/2015 0705   TRIG 151 (H) 10/16/2015 0705   HDL 40 (L) 10/16/2015 0705   CHOLHDL 3.8 10/16/2015 0705   VLDL 30 10/16/2015 0705   LDLCALC 83 10/16/2015 0705      Wt Readings from Last 3 Encounters:  01/10/17 159 lb (72.1 kg)  11/28/16 159 lb (72.1 kg)  06/02/16 180 lb (81.6 kg)      ASSESSMENT AND PLAN:  # Hypertension: # Hypokalemia: Blood pressure remains above goal.  She isn't tolerating lisinopril.  We will stop lisinopril and start doxazosin 2mg  qhs to increase to 4mg  if BP is >130/80.  Continue nebivolol, amlodipine, and spironolactone.  Will avoid Tribenzor given the concern for hypokalemia on HCTZ.  # PVCs:  PVCs much  better-controlled on nebivolol. Continue current dose.  # Atypical Chest Pain: ETT was negative for ischemia.  Resolved.   Current medicines are reviewed at length with the patient today.  The patient does not have concerns regarding medicines.  The following changes have been made: stop lisinopril.  Start doxazosin.   Labs/ tests ordered today include:   No orders of the defined types were placed in this encounter.   Disposition:   FU with Mercede Rollo C. Oval Linsey, MD, [facility] in 1 month.   This note was written with the assistance of speech recognition software.  Please excuse any transcriptional errors.  Signed, Aarion Kittrell C. Oval Linsey, MD, [facility]  01/10/2017 4:50 PM    [place]

## 2017-01-10 NOTE — Patient Instructions (Addendum)
Medication Instructions:  STOP LISINOPRIL   START DOXAZOSIN (CARDURA)  2 MG AT BEDTIME   Labwork: NONE  Testing/Procedures: NONE  Follow-Up: Your physician recommends that you schedule a follow-up appointment in: 1 MONTH OV  Any Other Special Instructions Will Be Listed Below (If Applicable). MONITOR YOUR BLOOD PRESSURE AT HOME IF IT IS NOT CONSISTENTLY BELOW 130/80 INCREASE DOXAZOSIN TO 4 MG AT BEDTIME   If you need a refill on your cardiac medications before your next appointment, please call your pharmacy.

## 2017-02-02 ENCOUNTER — Other Ambulatory Visit: Payer: Self-pay | Admitting: Cardiovascular Disease

## 2017-02-02 MED ORDER — DOXAZOSIN MESYLATE 2 MG PO TABS: 2.0000 mg | ORAL_TABLET | Freq: Every day | ORAL | 1 refills | Status: DC

## 2017-02-02 NOTE — Telephone Encounter (Signed)
°*  STAT* If patient is at the pharmacy, call can be transferred to refill team.   1. Which medications need to be refilled? (please list name of each medication and dose if known) Doxazosin  2. Which pharmacy/location (including street and city if local pharmacy) is medication to be sent to?[facility] [phone] 3. Do they need a 30 day or 90 day supply? [place]

## 2017-02-08 NOTE — Progress Notes (Signed)
Cardiology Office Note   Date:  02/10/2017   ID:  Stephanie White, DOB 1960-03-20, MRN [id number]  PCP:  Lennie Odor, PA-C  Cardiologist:   Skeet Latch, MD   No chief complaint on file.    History of Present Illness: Stephanie White is a 56 y.o. female with hypertension, hypokalemia, PVCs and hyperlipidemia who presents for follow up.  Stephanie White has noted palpitations for the last two years.  Her palpitations were felt to be due to PVCs in the setting of hypokalemia. Spironolactone was added to her medical regimen to help with both hypokalemia and blood pressure.  Serum renin and aldosterone levels were checked and were both normal.  She was referred for a treadmill stress test 06/2015 that revealed no ischemia but she did have a hypertensive response to exercise.  She exercised for 7 minutes on a Bruce protocol, which is 9 METS. Her peak blood pressure was 163/100.  She developed lower extremity edema on amlodipine but has been able to tolerate 2.5 mg daily.  Stephanie White has a renal artery ultrasound that was negative for stenosis.    At a previous appointment valsartan was switched to irbesartan due to the recall.  She developed lethergy and stopped the irbesartan.  Nebivolol was reduced 2/2 bradycardia.  She was started on lisinopril instead but developed weakness after exercising.  At her last appointment her blood pressure was elevated.  She was started on doxazosin.   She has been tracking her blood pressures and they have been mostly in the 110s-130s over 60s-70s.  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  She stopped taking amlodipine due to a national recall and her blood pressure remains controlled.  She has not noted any chest pain, shortness of breath, lower extremity edema, orthopnea, or PND.  She continues to exercise regularly and has no exertional  symptoms.   Past Medical History:  Diagnosis Date  . Allergic rhinitis   . Anemia   . HTN (hypertension)   . Hypokalemia   . Leukopenia 01/04/2012  . PVC (premature ventricular contraction) 06/23/2015    Past Surgical History:  Procedure Laterality Date  . BREAST BIOPSY Left 2016  . CESAREAN SECTION     x2  . colonoscopy  2012   reportedly negative per patient  . ENDOMETRIAL ABLATION  12/2009     Current Outpatient Medications  Medication Sig Dispense Refill  . Bacillus Coagulans-Inulin (PROBIOTIC FORMULA) 1-250 BILLION-MG CAPS Take 1 tablet by mouth daily.    Marland Kitchen doxazosin (CARDURA) 2 MG tablet Take 1 tablet (2 mg total) by mouth daily. 90 tablet 3  . Multiple Vitamins-Minerals (ALIVE WOMENS 50+ PO) Take 1 tablet by mouth daily.    . nebivolol (BYSTOLIC) 10 MG tablet Take 1 tablet (10 mg total) by mouth daily. 90 tablet 3  . Omega-3 Fatty Acids (FISH OIL BURP-LESS PO) Take 1,200 mg by mouth daily.    Marland Kitchen OVER THE COUNTER MEDICATION Take 1 capsule by mouth daily.    Marland Kitchen spironolactone (ALDACTONE) 25 MG tablet Take 1 tablet (25 mg total) by mouth daily. 90 tablet 3  . Turmeric 500 MG TABS Take by mouth.     No current facility-administered medications for this visit.     Allergies:   Amlodipine; Hctz [hydrochlorothiazide]; Irbesartan; Lisinopril; Latex; and Tramadol     Social History:  The patient  reports that  has never  smoked. she has never used smokeless tobacco. She reports that she does not drink alcohol or use drugs.   Family History:  The patient's family history includes Cancer in her paternal uncle; Cancer (age of onset: 81) in her father; Diabetes in her mother; Heart disease in her brother, mother, and sister; Hypertension in her mother and sister.    ROS:  Please see the history of present illness.   Otherwise, review of systems are positive for none.   All other systems are reviewed and negative.    PHYSICAL EXAM: VS:  BP (!) 153/76   Pulse (!) 57   Ht 5\' 3"   (1.6 m)   Wt 158 lb 6.4 oz (71.8 kg)   SpO2 100%   BMI 28.06 kg/m  , BMI Body mass index is 28.06 kg/m. GENERAL:  Well appearing HEENT: Pupils equal round and reactive, fundi not visualized, oral mucosa unremarkable NECK:  No jugular venous distention, waveform within normal limits, carotid upstroke brisk and symmetric, no bruits, no thyromegaly LUNGS:  Clear to auscultation bilaterally HEART:  RRR.  PMI not displaced or sustained,S1 and S2 within normal limits, no S3, no S4, no clicks, no rubs, no murmurs ABD:  Flat, positive bowel sounds normal in frequency in pitch, no bruits, no rebound, no guarding, no midline pulsatile mass, no hepatomegaly, no splenomegaly EXT:  2 plus pulses throughout, no edema, no cyanosis no clubbing SKIN:  No rashes no nodules NEURO:  Cranial nerves II through XII grossly intact, motor grossly intact throughout PSYCH:  Cognitively intact, oriented to person place and time   EKG:  EKG is not ordered today. 01/15/16: Sinus bradycardia rate 49 bpm.  The ekg ordered 06/23/15 demonstrates sinus rhythm rate 62 bpm.   11/28/2013: Sinus bradycardia. Rate 49 bpm. Left anterior fascicular block. Nonspecific ST/T changes.    Recent Labs: 12/12/2016: BUN 19; Creatinine, Ser 0.73; Potassium 4.0; Sodium 139   05/07/15 Sodium 141, potassium 3.3, BUN 13, creatinine 0.62 TSH 1.75 Total cholesterol 103, triglycerides 111, HDL 38, LDL 44  Lipid Panel    Component Value Date/Time   CHOL 153 10/16/2015 0705   TRIG 151 (H) 10/16/2015 0705   HDL 40 (L) 10/16/2015 0705   CHOLHDL 3.8 10/16/2015 0705   VLDL 30 10/16/2015 0705   LDLCALC 83 10/16/2015 0705      Wt Readings from Last 3 Encounters:  02/10/17 158 lb 6.4 oz (71.8 kg)  01/10/17 159 lb (72.1 kg)  11/28/16 159 lb (72.1 kg)      ASSESSMENT AND PLAN:  # Hypertension: # Hypokalemia: Blood pressure is much better-controlled.  Her blood pressure is always elevated in the office due to whitecoat hypertension.   She keeps meticulous logs at home and it is been at goal.  Continue nebivolol, spironolactone, and doxazosin.  # PVCs:  Stable.  PVCs much better-controlled on nebivolol. Continue current dose.   Current medicines are reviewed at length with the patient today.  The patient does not have concerns regarding medicines.  The following changes have been made: none  Labs/ tests ordered today include:   No orders of the defined types were placed in this encounter.   Disposition:   FU with Jozette Castrellon C. Oval Linsey, MD, [facility] in 6 months.   This note was written with the assistance of speech recognition software.  Please excuse any transcriptional errors.  Signed, Oryon Gary C. Oval Linsey, MD, [facility]  02/10/2017 8:36 AM    [place]

## 2017-02-10 ENCOUNTER — Encounter: Payer: Self-pay | Admitting: Cardiovascular Disease

## 2017-02-10 ENCOUNTER — Ambulatory Visit (INDEPENDENT_AMBULATORY_CARE_PROVIDER_SITE_OTHER): Payer: 59 | Admitting: Cardiovascular Disease

## 2017-02-10 VITALS — BP 153/76 | HR 57 | Ht 63.0 in | Wt 158.4 lb

## 2017-02-10 DIAGNOSIS — I493 Ventricular premature depolarization: Secondary | ICD-10-CM | POA: Diagnosis not present

## 2017-02-10 MED ORDER — NEBIVOLOL HCL 10 MG PO TABS: 10.0000 mg | ORAL_TABLET | Freq: Every day | ORAL | 3 refills | Status: DC

## 2017-02-10 MED ORDER — DOXAZOSIN MESYLATE 2 MG PO TABS: 2.0000 mg | ORAL_TABLET | Freq: Every day | ORAL | 3 refills | Status: DC

## 2017-02-10 NOTE — Patient Instructions (Signed)
CONTINUE WITH CURRENT MEDICATIONS    Your physician wants you to follow-up in [place].You will receive a reminder letter in the mail two months in advance. If you don't receive a letter, please call our office to schedule the follow-up appointment.     If you need a refill on your cardiac medications before your next appointment, please call your pharmacy.

## 2017-03-07 ENCOUNTER — Other Ambulatory Visit: Payer: Self-pay | Admitting: Cardiovascular Disease

## 2017-03-07 MED ORDER — NEBIVOLOL HCL 10 MG PO TABS: 10.0000 mg | ORAL_TABLET | Freq: Every day | ORAL | 3 refills | Status: DC

## 2017-03-07 NOTE — Telephone Encounter (Signed)
°*  STAT* If patient is at the pharmacy, call can be transferred to refill team.   1. Which medications need to be refilled? (please list name of each medication and dose if known) Bystolic 10 mg   2. Which pharmacy/location (including street and city if local pharmacy) is medication to be sent to?[facility]   3. Do they need a 30 day or 90 day supply? [place]

## 2017-03-07 NOTE — Telephone Encounter (Signed)
Rx(s) sent to pharmacy electronically.  

## 2017-03-24 ENCOUNTER — Telehealth: Payer: Self-pay | Admitting: Cardiovascular Disease

## 2017-03-24 NOTE — Telephone Encounter (Signed)
New message    Patient calling to request note for Weight Watchers program. Please call

## 2017-03-24 NOTE — Telephone Encounter (Signed)
Returned call to patient she stated she needs a letter stating her weight lose goal is 155 lbs.She needs for weight watchers.Stated weight watchers told her her goal is 101 lbs to 141 lbs.Stated she feels like that would be too much weight loss.She needs letter by next Friday 2/15.Message sent to Dr.[place]'s nurse.

## 2017-03-29 NOTE — Telephone Encounter (Signed)
For her height the weight corresponding to a normal BMP would be between 115 and 141.  It's hard to arbitrarily pick 155 as a goal when that isn't guideline based.  Why does Weight Watchers need this?

## 2017-03-31 NOTE — Telephone Encounter (Signed)
New message ° °Pt verbalized that she is returning call for RN °

## 2017-03-31 NOTE — Telephone Encounter (Signed)
Left message to call back  

## 2017-04-03 NOTE — Telephone Encounter (Signed)
Left message to call back  

## 2017-04-04 NOTE — Telephone Encounter (Signed)
Spoke with patient and she needs letter stating that 155 is an acceptable weight for her to become lifetime member. With the membership she is able to stay on weight watchers through program for free. Discussed with Dr Oval Linsey and a letter can be done but she does agree with goal of BMI recommendations. Advised patient and she will just put off letter for now, she is paid up through March and will decide at that time what she wants to do.

## 2017-04-18 ENCOUNTER — Other Ambulatory Visit: Payer: Self-pay | Admitting: Obstetrics and Gynecology

## 2017-04-18 DIAGNOSIS — Z1231 Encounter for screening mammogram for malignant neoplasm of breast: Secondary | ICD-10-CM

## 2017-06-05 ENCOUNTER — Ambulatory Visit
Admission: RE | Admit: 2017-06-05 | Discharge: 2017-06-05 | Disposition: A | Discharge status: Home / Self Care | Payer: 59 | Source: Ambulatory Visit | Attending: Obstetrics and Gynecology | Admitting: Obstetrics and Gynecology

## 2017-06-05 DIAGNOSIS — Z1231 Encounter for screening mammogram for malignant neoplasm of breast: Secondary | ICD-10-CM | POA: Diagnosis not present

## 2017-06-07 ENCOUNTER — Telehealth: Payer: Self-pay | Admitting: Cardiovascular Disease

## 2017-06-07 NOTE — Telephone Encounter (Signed)
Pt need a [employer]. Please advise pt

## 2017-06-07 NOTE — Telephone Encounter (Signed)
[employer] savings card placed at front desk for patient to pick up.  Patient aware.

## 2017-06-09 ENCOUNTER — Other Ambulatory Visit: Payer: Self-pay

## 2017-06-09 MED ORDER — DOXAZOSIN MESYLATE 2 MG PO TABS: 2.0000 mg | ORAL_TABLET | Freq: Every day | ORAL | 3 refills | Status: DC

## 2017-07-20 DIAGNOSIS — R6884 Jaw pain: Secondary | ICD-10-CM | POA: Diagnosis not present

## 2017-07-28 DIAGNOSIS — R6884 Jaw pain: Secondary | ICD-10-CM | POA: Diagnosis not present

## 2017-08-10 ENCOUNTER — Encounter: Payer: Self-pay | Admitting: Cardiovascular Disease

## 2017-08-10 ENCOUNTER — Ambulatory Visit (INDEPENDENT_AMBULATORY_CARE_PROVIDER_SITE_OTHER): Payer: 59 | Admitting: Cardiovascular Disease

## 2017-08-10 VITALS — BP 196/83 | HR 57 | Ht 63.0 in | Wt 158.0 lb

## 2017-08-10 DIAGNOSIS — I10 Essential (primary) hypertension: Secondary | ICD-10-CM | POA: Diagnosis not present

## 2017-08-10 DIAGNOSIS — I493 Ventricular premature depolarization: Secondary | ICD-10-CM | POA: Diagnosis not present

## 2017-08-10 MED ORDER — SPIRONOLACTONE 25 MG PO TABS: 25.0000 mg | ORAL_TABLET | Freq: Every day | ORAL | 3 refills | Status: DC

## 2017-08-10 NOTE — Progress Notes (Signed)
Cardiology Office Note   Date:  08/10/2017   ID:  Stephanie White, DOB [birth year], MRN [id number]  PCP:  Lennie Odor, PA-C  Cardiologist:   Skeet Latch, MD   Chief Complaint  Patient presents with  . Follow-up     History of Present Illness: Stephanie White is a 57 y.o. female with hypertension, hypokalemia, PVCs and hyperlipidemia who presents for follow up.  Stephanie White has noted palpitations for the last two years.  Her palpitations were felt to be due to PVCs in the setting of hypokalemia. Spironolactone was added to her medical regimen to help with both hypokalemia and blood pressure.  Serum renin and aldosterone levels were checked and were both normal.  She was referred for a treadmill stress test 06/2015 that revealed no ischemia but she did have a hypertensive response to exercise.  She exercised for 7 minutes on a Bruce protocol, which is 9 METS. Her peak blood pressure was 163/100.  She developed lower extremity edema on amlodipine but has been able to tolerate 2.5 mg daily.  Stephanie White has a renal artery ultrasound that was negative for stenosis.    Since her last appointment Stephanie White has been doing well.  She continues to exercise regularly with her YouTube aerobic videos.  She also does yard work.  She has no exertional chest pain or shortness of breath.  She also denies lower extremity edema, orthopnea, or PND.  She continues to  participate in weight watchers.  She is lost 4 inches off her waist and has maintained her weight her only complaint is fatigue that she attributes to her loss.  Blood pressure medication.  However it is manageable and she notices that it is better when she is per to spitting and exercise.  She brings a log of her blood pressures showing that it is been ranging from the 110s to the 130s with most readings being in the 120s.  She recently saw her PCP and her blood pressure was 122/74.   Past Medical History:    Diagnosis Date  . Allergic rhinitis   . Anemia   . HTN (hypertension)   . Hypokalemia   . Leukopenia 01/04/2012  . PVC (premature ventricular contraction) 06/23/2015    Past Surgical History:  Procedure Laterality Date  . BREAST BIOPSY Left 2016  . CESAREAN SECTION     x2  . colonoscopy  2012   reportedly negative per patient  . ENDOMETRIAL ABLATION  12/2009     Current Outpatient Medications  Medication Sig Dispense Refill  . Bacillus Coagulans-Inulin (PROBIOTIC FORMULA) 1-250 BILLION-MG CAPS Take 1 tablet by mouth daily.    Marland Kitchen doxazosin (CARDURA) 2 MG tablet Take 1 tablet (2 mg total) by mouth daily. 90 tablet 3  . Multiple Vitamins-Minerals (ALIVE WOMENS 50+ PO) Take 1 tablet by mouth daily.    . nebivolol (BYSTOLIC) 10 MG tablet Take 1 tablet (10 mg total) by mouth daily. 90 tablet 3  . Omega-3 Fatty Acids (FISH OIL BURP-LESS PO) Take 1,200 mg by mouth daily.    Marland Kitchen OVER THE COUNTER MEDICATION Take 1 capsule by mouth daily.    Marland Kitchen spironolactone (ALDACTONE) 25 MG tablet Take 1 tablet (25 mg total) by mouth daily. 90 tablet 3  . Turmeric 500 MG TABS Take by mouth.     No current facility-administered medications for this visit.     Allergies:   Amlodipine; Hctz [hydrochlorothiazide]; Irbesartan; Lisinopril; Latex; and Tramadol     Social History:  The patient  reports that she has never smoked. She has never used smokeless tobacco. She reports that she does not drink alcohol or use drugs.   Family History:  The patient's family history includes Cancer in her paternal uncle; Cancer (age of onset: 46) in her father; Diabetes in her mother; Heart disease in her brother, mother, and sister; Hypertension in her mother and sister.    ROS:  Please see the history of present illness.   Otherwise, review of systems are positive for none.   All other systems are reviewed and negative.    PHYSICAL EXAM: VS:  BP (!) 196/83   Pulse (!) 57   Ht 5\' 3"  (1.6 m)   Wt 158 lb (71.7 kg)    BMI 27.99 kg/m  , BMI Body mass index is 27.99 kg/m. GENERAL:  Well appearing HEENT: Pupils equal round and reactive, fundi not visualized, oral mucosa unremarkable NECK:  No jugular venous distention, waveform within normal limits, carotid upstroke brisk and symmetric, no bruits LUNGS:  Clear to auscultation bilaterally HEART:  RRR.  PMI not displaced or sustained,S1 and S2 within normal limits, no S3, no S4, no clicks, no rubs, no murmurs ABD:  Flat, positive bowel sounds normal in frequency in pitch, no bruits, no rebound, no guarding, no midline pulsatile mass, no hepatomegaly, no splenomegaly EXT:  2 plus pulses throughout, no edema, no cyanosis no clubbing SKIN:  No rashes no nodules NEURO:  Cranial nerves II through XII grossly intact, motor grossly intact throughout PSYCH:  Cognitively intact, oriented to person place and time    EKG:  EKG is ordered today. 01/15/16: Sinus bradycardia rate 49 bpm.  The ekg ordered 06/23/15 demonstrates sinus rhythm rate 62 bpm.   11/28/2013: Sinus bradycardia. Rate 49 bpm. Left anterior fascicular block. Nonspecific ST/T changes.     Recent Labs: 12/12/2016: BUN 19; Creatinine, Ser 0.73; Potassium 4.0; Sodium 139   05/07/15 Sodium 141, potassium 3.3, BUN 13, creatinine 0.62 TSH 1.75 Total cholesterol 103, triglycerides 111, HDL 38, LDL 44  04/20/2017:  WBC 3.5, hemoglobin 11.7, hematocrit 35.6, platelets 202 Sodium 143, potassium 4.0, BUN 16, creatinine 0.7 AST 24, ALT 17 Total cholesterol 140, triglycerides 97, HDL 44, LDL 77 TSH 3.15   Lipid Panel    Component Value Date/Time   CHOL 153 10/16/2015 0705   TRIG 151 (H) 10/16/2015 0705   HDL 40 (L) 10/16/2015 0705   CHOLHDL 3.8 10/16/2015 0705   VLDL 30 10/16/2015 0705   LDLCALC 83 10/16/2015 0705      Wt Readings from Last 3 Encounters:  08/10/17 158 lb (71.7 kg)  02/10/17 158 lb 6.4 oz (71.8 kg)  01/10/17 159 lb (72.1 kg)      ASSESSMENT AND PLAN:  # Hypertension: #  Hypokalemia: Blood pressure is very well-controlled at home.  She now checks it just once per week.  It is unclear why it is always so high here given that it is well-controlled at other doctor's offices.  Nevertheless will not make any changes at this time.  Continue nebivolol, doxazosin, and spironolactone.  # PVCs:  Stable.  PVCs much better-controlled on nebivolol. Continue current dose.   Current medicines are reviewed at length with the patient today.  The patient does not have concerns regarding medicines.  The following changes have been made: none  Labs/ tests ordered today include:   Orders Placed This Encounter  Procedures  . EKG 12-Lead    Disposition:   FU with Nakota Elsen C.  Oval Linsey, MD, [facility] in 1 yr.   Signed, Angeleen Horney C. Oval Linsey, MD, [facility]  08/10/2017 4:21 PM    [place]

## 2017-08-10 NOTE — Patient Instructions (Signed)
Your physician wants you to follow-up in: ONE YEAR with Dr. Oval Linsey. You will receive a reminder letter in the mail two months in advance. If you don't receive a letter, please call our office to schedule the follow-up appointment.

## 2017-08-11 ENCOUNTER — Telehealth: Payer: Self-pay | Admitting: Cardiovascular Disease

## 2017-08-11 MED ORDER — SPIRONOLACTONE 25 MG PO TABS: 25.0000 mg | ORAL_TABLET | Freq: Every day | ORAL | 3 refills | Status: DC

## 2017-08-11 NOTE — Telephone Encounter (Signed)
Rx(s) sent to pharmacy electronically.  

## 2017-08-11 NOTE — Telephone Encounter (Signed)
New Message:        *STAT* If patient is at the pharmacy, call can be transferred to refill team.   1. Which medications need to be refilled? (please list name of each medication and dose if known) spironolactone (ALDACTONE) 25 MG tablet  2. Which pharmacy/location (including street and city if local pharmacy) is medication to be sent to?[facility] [place], [place] - [address]  3. Do they need a 30 day or 90 day supply? [place]

## 2017-08-23 ENCOUNTER — Encounter: Payer: Self-pay | Admitting: Cardiovascular Disease

## 2017-09-28 DIAGNOSIS — Z87410 Personal history of cervical dysplasia: Secondary | ICD-10-CM | POA: Diagnosis not present

## 2017-09-28 DIAGNOSIS — Z130 Encounter for screening for diseases of the blood and blood-forming organs and certain disorders involving the immune mechanism: Secondary | ICD-10-CM | POA: Diagnosis not present

## 2017-09-28 DIAGNOSIS — Z01419 Encounter for gynecological examination (general) (routine) without abnormal findings: Secondary | ICD-10-CM | POA: Diagnosis not present

## 2017-09-29 DIAGNOSIS — Z87410 Personal history of cervical dysplasia: Secondary | ICD-10-CM | POA: Diagnosis not present

## 2017-09-29 DIAGNOSIS — R82998 Other abnormal findings in urine: Secondary | ICD-10-CM | POA: Diagnosis not present

## 2017-10-02 DIAGNOSIS — D165 Benign neoplasm of lower jaw bone: Secondary | ICD-10-CM | POA: Diagnosis not present

## 2017-11-07 DIAGNOSIS — Z23 Encounter for immunization: Secondary | ICD-10-CM | POA: Diagnosis not present

## 2017-11-28 ENCOUNTER — Other Ambulatory Visit: Payer: Self-pay | Admitting: Cardiovascular Disease

## 2017-12-01 DIAGNOSIS — N762 Acute vulvitis: Secondary | ICD-10-CM | POA: Diagnosis not present

## 2017-12-28 DIAGNOSIS — I10 Essential (primary) hypertension: Secondary | ICD-10-CM | POA: Diagnosis not present

## 2017-12-28 DIAGNOSIS — Z0000 Encounter for general adult medical examination without abnormal findings: Secondary | ICD-10-CM | POA: Diagnosis not present

## 2018-01-19 DIAGNOSIS — Z86010 Personal history of colonic polyps: Secondary | ICD-10-CM | POA: Diagnosis not present

## 2018-01-19 DIAGNOSIS — K635 Polyp of colon: Secondary | ICD-10-CM | POA: Diagnosis not present

## 2018-01-19 DIAGNOSIS — K648 Other hemorrhoids: Secondary | ICD-10-CM | POA: Diagnosis not present

## 2018-02-10 ENCOUNTER — Other Ambulatory Visit: Payer: Self-pay | Admitting: Cardiovascular Disease

## 2018-04-24 ENCOUNTER — Other Ambulatory Visit: Payer: Self-pay | Admitting: Obstetrics and Gynecology

## 2018-04-24 DIAGNOSIS — Z1231 Encounter for screening mammogram for malignant neoplasm of breast: Secondary | ICD-10-CM

## 2018-05-05 ENCOUNTER — Other Ambulatory Visit: Payer: Self-pay | Admitting: Cardiovascular Disease

## 2018-06-11 ENCOUNTER — Ambulatory Visit: Payer: 59

## 2018-07-30 ENCOUNTER — Ambulatory Visit
Admission: RE | Admit: 2018-07-30 | Discharge: 2018-07-30 | Disposition: A | Discharge status: Home / Self Care | Payer: BC Managed Care – PPO | Source: Ambulatory Visit | Attending: Obstetrics and Gynecology | Admitting: Obstetrics and Gynecology

## 2018-07-30 ENCOUNTER — Other Ambulatory Visit: Payer: Self-pay

## 2018-07-30 DIAGNOSIS — Z1231 Encounter for screening mammogram for malignant neoplasm of breast: Secondary | ICD-10-CM | POA: Diagnosis not present

## 2018-08-01 DIAGNOSIS — R42 Dizziness and giddiness: Secondary | ICD-10-CM | POA: Diagnosis not present

## 2018-08-21 DIAGNOSIS — Z20828 Contact with and (suspected) exposure to other viral communicable diseases: Secondary | ICD-10-CM | POA: Diagnosis not present

## 2018-10-04 DIAGNOSIS — Z1389 Encounter for screening for other disorder: Secondary | ICD-10-CM | POA: Diagnosis not present

## 2018-10-04 DIAGNOSIS — Z01419 Encounter for gynecological examination (general) (routine) without abnormal findings: Secondary | ICD-10-CM | POA: Diagnosis not present

## 2018-10-04 DIAGNOSIS — Z87410 Personal history of cervical dysplasia: Secondary | ICD-10-CM | POA: Diagnosis not present

## 2018-10-04 DIAGNOSIS — Z130 Encounter for screening for diseases of the blood and blood-forming organs and certain disorders involving the immune mechanism: Secondary | ICD-10-CM | POA: Diagnosis not present

## 2018-10-05 DIAGNOSIS — Z87410 Personal history of cervical dysplasia: Secondary | ICD-10-CM | POA: Diagnosis not present

## 2018-10-24 ENCOUNTER — Ambulatory Visit: Payer: BC Managed Care – PPO | Admitting: Cardiovascular Disease

## 2018-11-01 ENCOUNTER — Telehealth (INDEPENDENT_AMBULATORY_CARE_PROVIDER_SITE_OTHER): Payer: BC Managed Care – PPO | Admitting: Cardiovascular Disease

## 2018-11-01 VITALS — BP 131/71 | HR 51 | Ht 63.0 in | Wt 166.6 lb

## 2018-11-01 DIAGNOSIS — I493 Ventricular premature depolarization: Secondary | ICD-10-CM | POA: Diagnosis not present

## 2018-11-01 DIAGNOSIS — I10 Essential (primary) hypertension: Secondary | ICD-10-CM

## 2018-11-01 MED ORDER — SPIRONOLACTONE 25 MG PO TABS: 25.0000 mg | ORAL_TABLET | Freq: Every day | ORAL | 3 refills | Status: DC

## 2018-11-01 MED ORDER — NEBIVOLOL HCL 10 MG PO TABS: 10.0000 mg | ORAL_TABLET | Freq: Every day | ORAL | 3 refills | Status: DC

## 2018-11-01 MED ORDER — DOXAZOSIN MESYLATE 2 MG PO TABS: 2.0000 mg | ORAL_TABLET | Freq: Every day | ORAL | 3 refills | Status: DC

## 2018-11-01 NOTE — Patient Instructions (Signed)
Medication Instructions:  Your physician recommends that you continue on your current medications as directed. Please refer to the Current Medication list given to you today.  If you need a refill on your cardiac medications before your next appointment, please call your pharmacy.   Lab work: NONE  Testing/Procedures: NONE  Follow-Up: At [facility], you and your health needs are our priority.  As part of our continuing mission to provide you with exceptional heart care, we have created designated Provider Care Teams.  These Care Teams include your primary Cardiologist (physician) and Advanced Practice Providers (APPs -  Physician Assistants and Nurse Practitioners) who all work together to provide you with the care you need, when you need it. You will need a follow up appointment in 12 months.  Please call our office 2 months in advance to schedule this appointment.  You may see Tiffany Belk, MD or one of the following Advanced Practice Providers on your designated Care Team:   Luke Kilroy, PA-C Krista Kroeger, PA-C . Callie Goodrich, PA-C    

## 2018-11-01 NOTE — Progress Notes (Signed)
Virtual Visit via Video Note   This visit type was conducted due to national recommendations for restrictions regarding the COVID-19 Pandemic (e.g. social distancing) in an effort to limit this patient's exposure and mitigate transmission in our community.  Due to her co-morbid illnesses, this patient is at least at moderate risk for complications without adequate follow up.  This format is felt to be most appropriate for this patient at this time.  All issues noted in this document were discussed and addressed.  A limited physical exam was performed with this format.  Please refer to the patient's chart for her consent to telehealth for [facility].   Date:  11/01/2018   ID:  Stephanie White, DOB 1960/12/30, MRN [id number]  Patient Location: Home Provider Location: Office  PCP:  Lennie Odor, PA-C  Cardiologist:  Skeet Latch, MD  Electrophysiologist:  None   Evaluation Performed:  Follow-Up Visit  Chief Complaint:  Follow up  History of Present Illness:    Stephanie White is a 58 y.o. female with hypertension, hypokalemia, PVCs and hyperlipidemia who presents for follow up.  Stephanie White was initially seen for palpitations. Her palpitations were felt to be due to PVCs in the setting of hypokalemia. Spironolactone was added to her medical regimen to help with both hypokalemia and blood pressure.  Serum renin and aldosterone levels were checked and were both normal.  She was referred for a treadmill stress test 06/2015 that revealed no ischemia but she did have a hypertensive response to exercise.  She exercised for 7 minutes on a Bruce protocol, which is 9 METS. Her peak blood pressure was 163/100.  She developed lower extremity edema on amlodipine but has been able to tolerate 2.5 mg daily.  Stephanie White has a renal artery ultrasound that was negative for stenosis.    Stephanie White has been working from home since March due to coronavirus.  She has been  feeling stressed but otherwise well.  She can tell when her BP goes up when she is stressed.  She tries to go walking when she gets stressed.  She continues to exercise daily.  Her BP is usually controlled in the mornings and rises at times with stress.  Her weight has increased 6lb. She has been feeling well otherwise.  She has no chest pain, shortness of breath, lower extremity edema, orthopnea or PND.   The patient does not have symptoms concerning for COVID-19 infection (fever, chills, cough, or new shortness of breath).    Past Medical History:  Diagnosis Date  . Allergic rhinitis   . Anemia   . HTN (hypertension)   . Hypokalemia   . Leukopenia 01/04/2012  . PVC (premature ventricular contraction) 06/23/2015   Past Surgical History:  Procedure Laterality Date  . BREAST BIOPSY Left 2016  . CESAREAN SECTION     x2  . colonoscopy  2012   reportedly negative per patient  . ENDOMETRIAL ABLATION  12/2009     Current Meds  Medication Sig  . Bacillus Coagulans-Inulin (PROBIOTIC FORMULA) 1-250 BILLION-MG CAPS Take 1 tablet by mouth daily.  Marland Kitchen BYSTOLIC 10 MG tablet TAKE 1 TABLET BY MOUTH EVERY DAY  . doxazosin (CARDURA) 2 MG tablet TAKE 1 TABLET BY MOUTH EVERY DAY  . Multiple Vitamins-Minerals (ALIVE WOMENS 50+ PO) Take 1 tablet by mouth daily.  . Omega-3 Fatty Acids (FISH OIL BURP-LESS PO) Take 1,200 mg by mouth daily.  Marland Kitchen OVER THE COUNTER MEDICATION Take 1 capsule by mouth daily. Beet root  .  spironolactone (ALDACTONE) 25 MG tablet TAKE 1 TABLET BY MOUTH EVERY DAY  . Turmeric 500 MG TABS Take by mouth.     Allergies:   Amlodipine, Hctz [hydrochlorothiazide], Irbesartan, Lisinopril, Latex, and Tramadol   Social History   Tobacco Use  . Smoking status: Never Smoker  . Smokeless tobacco: Never Used  Substance Use Topics  . Alcohol use: No  . Drug use: No     Family Hx: The patient's family history includes Cancer in her paternal uncle; Cancer (age of onset: 15) in her father;  Diabetes in her mother; Heart disease in her brother, mother, and sister; Hypertension in her mother and sister. There is no history of Heart attack or Stroke.  ROS:   Please see the history of present illness.     All other systems reviewed and are negative.   Prior CV studies:   The following studies were reviewed today:  ETT 07/10/15:  Negative for ischemia.  9 METS.  Labs/Other Tests and Data Reviewed:    EKG:  An ECG dated 08/10/17 was personally reviewed today and demonstrated:  sinus bradycardia.  Rate 57 bpm.  Recent Labs: No results found for requested labs within last 8760 hours.   Recent Lipid Panel Lab Results  Component Value Date/Time   CHOL 153 10/16/2015 07:05 AM   TRIG 151 (H) 10/16/2015 07:05 AM   HDL 40 (L) 10/16/2015 07:05 AM   CHOLHDL 3.8 10/16/2015 07:05 AM   LDLCALC 83 10/16/2015 07:05 AM    Wt Readings from Last 3 Encounters:  11/01/18 166 lb 9.6 oz (75.6 kg)  08/10/17 158 lb (71.7 kg)  02/10/17 158 lb 6.4 oz (71.8 kg)     Objective:    Vital Signs:  BP 131/71   Pulse (!) 51   Ht 5\' 3"  (1.6 m)   Wt 166 lb 9.6 oz (75.6 kg)   BMI 29.51 kg/m    VITAL SIGNS:  reviewed GEN:  no acute distress EYES:  sclerae anicteric, EOMI - Extraocular Movements Intact RESPIRATORY:  normal respiratory effort, symmetric expansion NEURO:  alert and oriented x 3, no obvious focal deficit PSYCH:  normal affect  ASSESSMENT & PLAN:    # Hypertension: # Hypokalemia: Blood pressure is well-controlled.  We discussed stress management, weight loss and exercise.  Continue nebivolol, doxazosin and spironolactone.  # PVCs:  No active symptoms.  PVCs much better-controlled on nebivolol. Continue current dose.  She had to stop watching the news.    COVID-19 Education: The signs and symptoms of COVID-19 were discussed with the patient and how to seek care for testing (follow up with PCP or arrange E-visit).  The importance of social distancing was discussed today.   Time:   Today, I have spent 17 minutes with the patient with telehealth technology discussing the above problems.     Medication Adjustments/Labs and Tests Ordered: Current medicines are reviewed at length with the patient today.  Concerns regarding medicines are outlined above.   Tests Ordered: No orders of the defined types were placed in this encounter.   Medication Changes: No orders of the defined types were placed in this encounter.   Follow Up:  In Person in 1 year(s)  Signed, Skeet Latch, MD  11/01/2018 8:07 AM    [place]

## 2018-11-14 ENCOUNTER — Ambulatory Visit: Payer: BC Managed Care – PPO | Admitting: Cardiovascular Disease

## 2018-12-31 DIAGNOSIS — Z0000 Encounter for general adult medical examination without abnormal findings: Secondary | ICD-10-CM | POA: Diagnosis not present

## 2018-12-31 DIAGNOSIS — I10 Essential (primary) hypertension: Secondary | ICD-10-CM | POA: Diagnosis not present

## 2018-12-31 DIAGNOSIS — E785 Hyperlipidemia, unspecified: Secondary | ICD-10-CM | POA: Diagnosis not present

## 2019-06-11 ENCOUNTER — Other Ambulatory Visit: Payer: Self-pay | Admitting: Obstetrics and Gynecology

## 2019-06-11 DIAGNOSIS — Z1231 Encounter for screening mammogram for malignant neoplasm of breast: Secondary | ICD-10-CM

## 2019-07-31 ENCOUNTER — Ambulatory Visit
Admission: RE | Admit: 2019-07-31 | Discharge: 2019-07-31 | Disposition: A | Discharge status: Home / Self Care | Payer: BC Managed Care – PPO | Source: Ambulatory Visit | Attending: Obstetrics and Gynecology | Admitting: Obstetrics and Gynecology

## 2019-07-31 ENCOUNTER — Ambulatory Visit: Payer: BC Managed Care – PPO

## 2019-07-31 ENCOUNTER — Other Ambulatory Visit: Payer: Self-pay

## 2019-07-31 DIAGNOSIS — Z1231 Encounter for screening mammogram for malignant neoplasm of breast: Secondary | ICD-10-CM

## 2019-10-07 DIAGNOSIS — Z6830 Body mass index (BMI) 30.0-30.9, adult: Secondary | ICD-10-CM | POA: Diagnosis not present

## 2019-10-07 DIAGNOSIS — Z130 Encounter for screening for diseases of the blood and blood-forming organs and certain disorders involving the immune mechanism: Secondary | ICD-10-CM | POA: Diagnosis not present

## 2019-10-07 DIAGNOSIS — Z01419 Encounter for gynecological examination (general) (routine) without abnormal findings: Secondary | ICD-10-CM | POA: Diagnosis not present

## 2019-10-07 DIAGNOSIS — Z1389 Encounter for screening for other disorder: Secondary | ICD-10-CM | POA: Diagnosis not present

## 2019-11-01 ENCOUNTER — Encounter: Payer: Self-pay | Admitting: Cardiovascular Disease

## 2019-11-01 ENCOUNTER — Ambulatory Visit (INDEPENDENT_AMBULATORY_CARE_PROVIDER_SITE_OTHER): Payer: BC Managed Care – PPO | Admitting: Cardiovascular Disease

## 2019-11-01 ENCOUNTER — Other Ambulatory Visit: Payer: Self-pay

## 2019-11-01 VITALS — BP 150/71 | HR 57 | Temp 93.2°F | Ht 63.0 in | Wt 177.4 lb

## 2019-11-01 DIAGNOSIS — I10 Essential (primary) hypertension: Secondary | ICD-10-CM

## 2019-11-01 MED ORDER — DOXAZOSIN MESYLATE 4 MG PO TABS: 4.0000 mg | ORAL_TABLET | Freq: Every day | ORAL | 3 refills | Status: DC

## 2019-11-01 MED ORDER — SPIRONOLACTONE 25 MG PO TABS: 25.0000 mg | ORAL_TABLET | Freq: Every day | ORAL | 3 refills | Status: DC

## 2019-11-01 MED ORDER — NEBIVOLOL HCL 10 MG PO TABS: 10.0000 mg | ORAL_TABLET | Freq: Every day | ORAL | 3 refills | Status: DC

## 2019-11-01 NOTE — Progress Notes (Signed)
Cardiology Office Note   Date:  11/01/2019   ID:  Stephanie White, DOB 1960/03/15, MRN [id number]  PCP:  Lennie Odor, PA  Cardiologist:  Skeet Latch, MD  Electrophysiologist:  None   Evaluation Performed:  Follow-Up Visit  Chief Complaint:  Follow up  History of Present Illness:    Stephanie White is a 59 y.o. female with hypertension, hypokalemia, PVCs and hyperlipidemia who presents for follow up.  Stephanie White was initially seen for palpitations. Her palpitations were felt to be due to PVCs in the setting of hypokalemia. Spironolactone was added to her medical regimen to help with both hypokalemia and blood pressure.  Serum renin and aldosterone levels were checked and were both normal.  She was referred for a treadmill stress test 06/2015 that revealed no ischemia but she did have a hypertensive response to exercise.  She exercised for 7 minutes on a Bruce protocol, which is 9 METS. Her peak blood pressure was 163/100.  She developed lower extremity edema on amlodipine but has been able to tolerate 2.5 mg daily.  Stephanie White has a renal artery ultrasound that was negative for stenosis.    Stephanie White has been doing okay since her last appointment.  She has been working from home for the last 2 years due to the pandemic.  This has been stressful.  She is suffering from some stress headaches.  She has not checked her blood pressure much at home lately because it stresses her out.  She saw her OB recently and her blood pressure was in the 160s over 70s.  She notes that she can still exercises every morning but in general is less active.  She used to get 10,000 steps daily and now only gets 5000.  She started back with Weight Watchers a few weeks ago.  She is trying to eat more healthy.  Overall she feels good with exercise and has no chest pain or shortness of breath.  Past Medical History:  Diagnosis Date  . Allergic rhinitis   . Anemia   . HTN  (hypertension)   . Hypokalemia   . Leukopenia 01/04/2012  . PVC (premature ventricular contraction) 06/23/2015   Past Surgical History:  Procedure Laterality Date  . BREAST BIOPSY Left 2016  . CESAREAN SECTION     x2  . colonoscopy  2012   reportedly negative per patient  . ENDOMETRIAL ABLATION  12/2009     Current Meds  Medication Sig  . Bacillus Coagulans-Inulin (PROBIOTIC FORMULA) 1-250 BILLION-MG CAPS Take 1 tablet by mouth daily.  Marland Kitchen doxazosin (CARDURA) 4 MG tablet Take 1 tablet (4 mg total) by mouth daily.  . Multiple Vitamins-Minerals (ALIVE WOMENS 50+ PO) Take 1 tablet by mouth daily.  . nebivolol (BYSTOLIC) 10 MG tablet Take 1 tablet (10 mg total) by mouth daily.  . Omega-3 Fatty Acids (FISH OIL BURP-LESS PO) Take 1,200 mg by mouth daily.  Marland Kitchen OVER THE COUNTER MEDICATION Take 1 capsule by mouth daily. Beet root  . spironolactone (ALDACTONE) 25 MG tablet Take 1 tablet (25 mg total) by mouth daily.  . Turmeric 500 MG TABS Take by mouth.  . [DISCONTINUED] doxazosin (CARDURA) 2 MG tablet Take 1 tablet (2 mg total) by mouth daily.  . [DISCONTINUED] nebivolol (BYSTOLIC) 10 MG tablet Take 1 tablet (10 mg total) by mouth daily.  . [DISCONTINUED] spironolactone (ALDACTONE) 25 MG tablet Take 1 tablet (25 mg total) by mouth daily.     Allergies:   Amlodipine, Hctz [hydrochlorothiazide], Irbesartan, Lisinopril,  Latex, and Tramadol   Social History   Tobacco Use  . Smoking status: Never Smoker  . Smokeless tobacco: Never Used  Substance Use Topics  . Alcohol use: No  . Drug use: No     Family Hx: The patient's family history includes Cancer in her paternal uncle; Cancer (age of onset: 87) in her father; Diabetes in her mother; Heart disease in her brother, mother, and sister; Hypertension in her mother and sister. There is no history of Heart attack or Stroke.  ROS:   Please see the history of present illness.     All other systems reviewed and are negative.   Prior CV  studies:   The following studies were reviewed today:  ETT 07/10/15:  Negative for ischemia.  9 METS.  Labs/Other Tests and Data Reviewed:    EKG:  An ECG dated 08/10/17 was personally reviewed today and demonstrated:  sinus bradycardia.  Rate 57 bpm.   11/01/2019: Sinus bradycardia.  Rate 57 bpm.  Nonspecific T wave abnormalities.  Recent Labs: No results found for requested labs within last 8760 hours.   Recent Lipid Panel Lab Results  Component Value Date/Time   CHOL 153 10/16/2015 07:05 AM   TRIG 151 (H) 10/16/2015 07:05 AM   HDL 40 (L) 10/16/2015 07:05 AM   CHOLHDL 3.8 10/16/2015 07:05 AM   LDLCALC 83 10/16/2015 07:05 AM    Wt Readings from Last 3 Encounters:  11/01/19 177 lb 6.4 oz (80.5 kg)  11/01/18 166 lb 9.6 oz (75.6 kg)  08/10/17 158 lb (71.7 kg)     Objective:    VS:  BP (!) 150/71   Pulse (!) 57   Temp (!) 93.2 F (34 C)   Ht 5\' 3"  (1.6 m)   Wt 177 lb 6.4 oz (80.5 kg)   SpO2 99%   BMI 31.42 kg/m  , BMI Body mass index is 31.42 kg/m. GENERAL:  Well appearing HEENT: Pupils equal round and reactive, fundi not visualized, oral mucosa unremarkable NECK:  No jugular venous distention, waveform within normal limits, carotid upstroke brisk and symmetric, no bruits LUNGS:  Clear to auscultation bilaterally HEART:  RRR.  PMI not displaced or sustained,S1 and S2 within normal limits, no S3, no S4, no clicks, no rubs, no murmurs ABD:  Flat, positive bowel sounds normal in frequency in pitch, no bruits, no rebound, no guarding, no midline pulsatile mass, no hepatomegaly, no splenomegaly EXT:  2 plus pulses throughout, no edema, no cyanosis no clubbing SKIN:  No rashes no nodules NEURO:  Cranial nerves II through XII grossly intact, motor grossly intact throughout PSYCH:  Cognitively intact, oriented to person place and time   ASSESSMENT & PLAN:    # Hypertension: # Hypokalemia: Blood pressure is  very elevated today.  I think this is multifactorial.  We  discussed the fact that she has gained 20 pounds during Covid 19.  She is also been under a lot of stress from working at home every day.  She has been less physically active.  She is already back in weight watchers and exercising again.  We will increase her doxazosin to 4 mg.  Continue nebivolol and spironolactone at current doses.  She will start back tracking her blood pressure regularly.   # PVCs:  No active symptoms.  PVCs much better-controlled on nebivolol. Continue current dose.  She had to stop watching the news.    Medication Adjustments/Labs and Tests Ordered: Current medicines are reviewed at length with the patient  today.  Concerns regarding medicines are outlined above.   Tests Ordered: Orders Placed This Encounter  Procedures  . EKG 12-Lead    Medication Changes: Meds ordered this encounter  Medications  . spironolactone (ALDACTONE) 25 MG tablet    Sig: Take 1 tablet (25 mg total) by mouth daily.    Dispense:  90 tablet    Refill:  3  . nebivolol (BYSTOLIC) 10 MG tablet    Sig: Take 1 tablet (10 mg total) by mouth daily.    Dispense:  90 tablet    Refill:  3    CHANGE TO GENERIC WHEN AVAILABLE, WILL CALL WHEN NEEDS FILLED  . doxazosin (CARDURA) 4 MG tablet    Sig: Take 1 tablet (4 mg total) by mouth daily.    Dispense:  90 tablet    Refill:  3    NEW DOSE, D/C 2 MG RX    Follow Up: Virtually with APP in one month. F/u with Cherice Glennie C. Oval Linsey, MD, [facility] in 4 months  Signed, Skeet Latch, MD  11/01/2019 4:46 PM    [place]

## 2019-11-01 NOTE — Patient Instructions (Addendum)
Medication Instructions:  INCREASE DOXAZOSIN 4 MG DAILY   *If you need a refill on your cardiac medications before your next appointment, please call your pharmacy*  Lab Work: NONE   Testing/Procedures: NONE  Follow-Up: At [facility], you and your health needs are our priority.  As part of our continuing mission to provide you with exceptional heart care, we have created designated Provider Care Teams.  These Care Teams include your primary Cardiologist (physician) and Advanced Practice Providers (APPs -  Physician Assistants and Nurse Practitioners) who all work together to provide you with the care you need, when you need it.  We recommend signing up for the patient portal called "MyChart".  Sign up information is provided on this After Visit Summary.  MyChart is used to connect with patients for Virtual Visits (Telemedicine).  Patients are able to view lab/test results, encounter notes, upcoming appointments, etc.  Non-urgent messages can be sent to your provider as well.   To learn more about what you can do with MyChart, go to [email or web address].    Your next appointment:   VIRTUAL VISIT 1 MONTH WITH ONE OF THE FOLLOWING     Kerin Ransom, PA-C  [address], [place]  Coletta Memos, [place]  Your physician recommends that you schedule a follow-up appointment in: 3-4 MONTHS WITH DR [facility]   Other Instructions  MONITOR AND LOG YOUR BLOOD PRESSURE AT HOME, HAVE READINGS AVAILABLE FOR YOUR FOLLOW UP

## 2019-11-13 IMAGING — MG DIGITAL SCREENING BILATERAL MAMMOGRAM WITH TOMO AND CAD
8 series · 8 of 24 positions shown · non-contrast
Comparison: Previous exam(s).

CLINICAL DATA: Screening.

EXAM:
DIGITAL SCREENING BILATERAL MAMMOGRAM WITH TOMO AND CAD

[R MLO synth-2D]
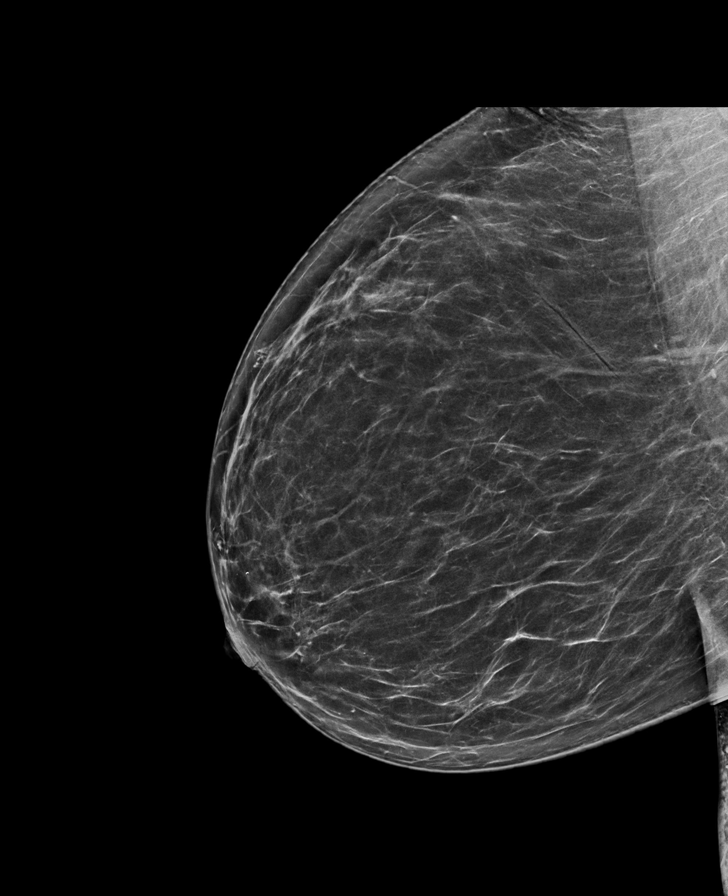

[L MLO synth-2D]
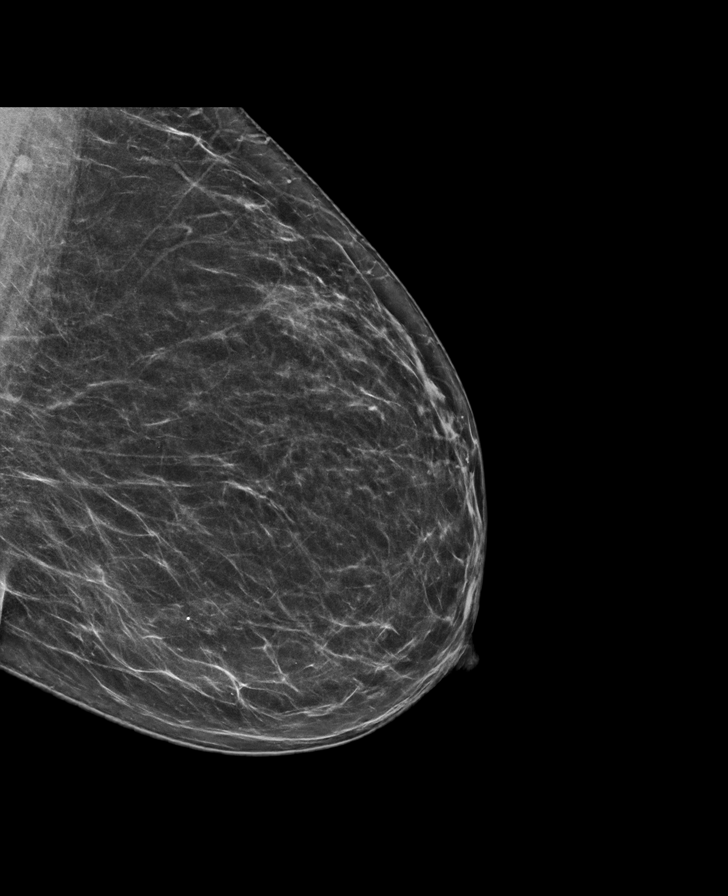

[R CC synth-2D]
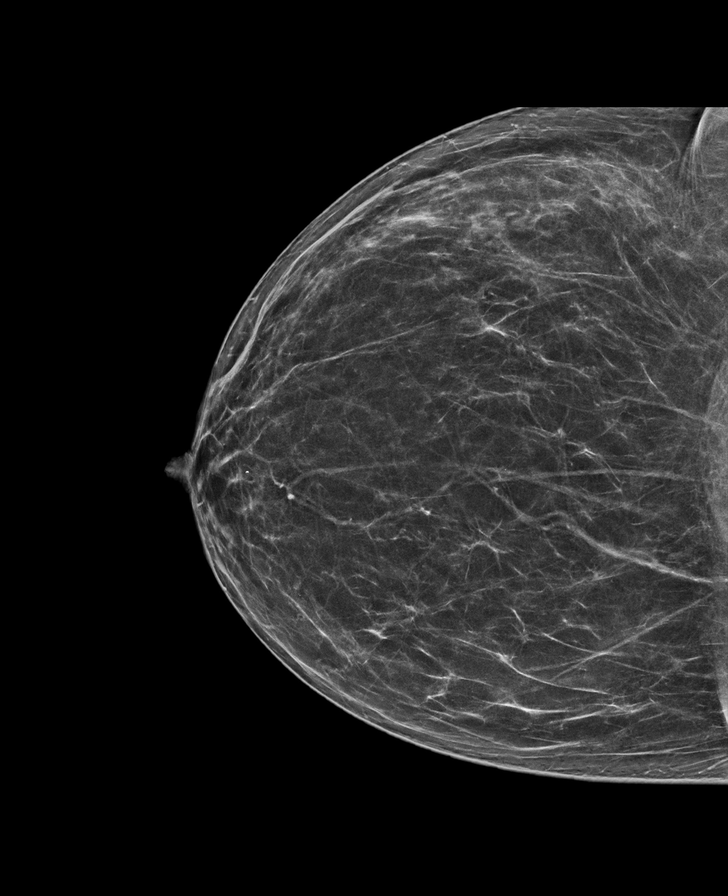

[L CC synth-2D]
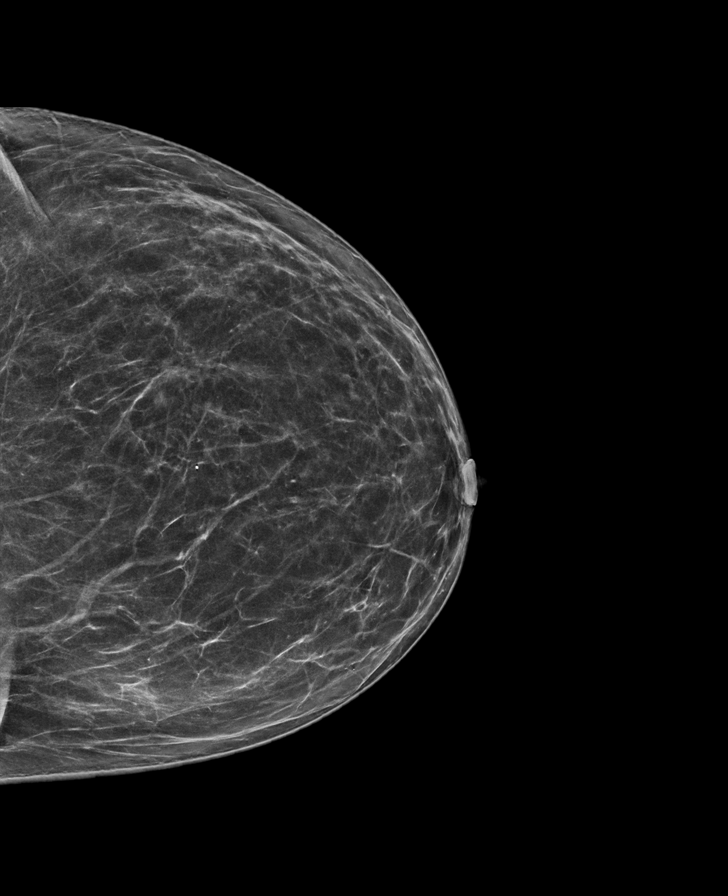

[L MLO tomo · tomo slice 37/73.0]
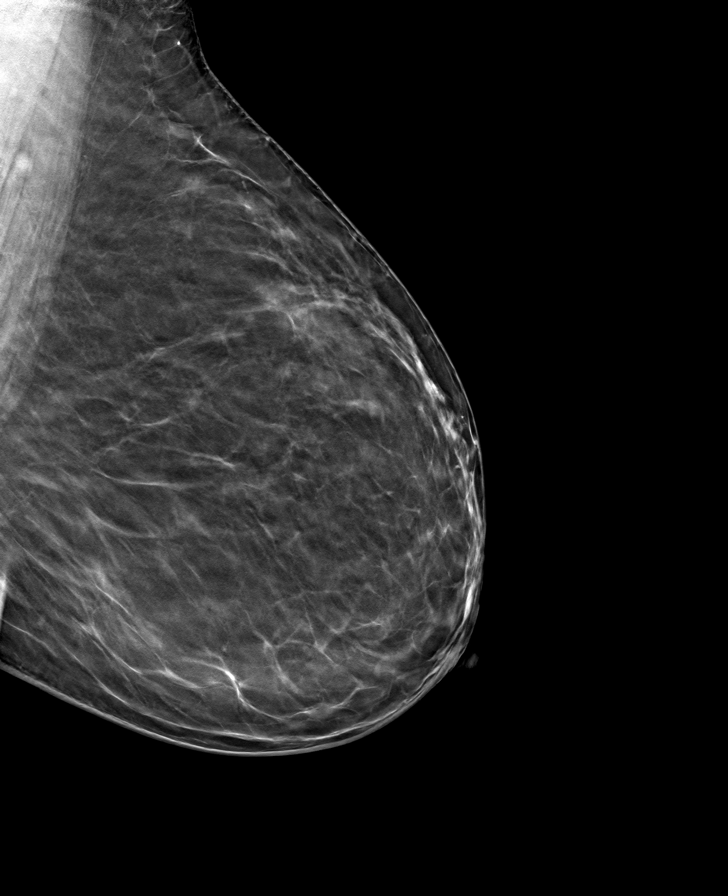

[R MLO tomo · tomo slice 40/79.0]
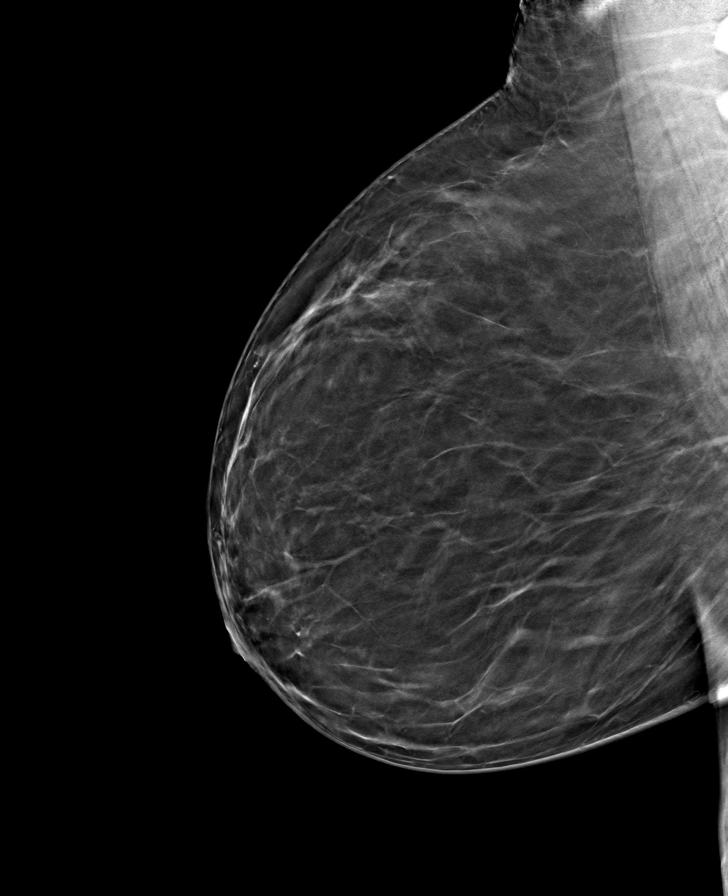

[L CC tomo · tomo slice 35/70.0]
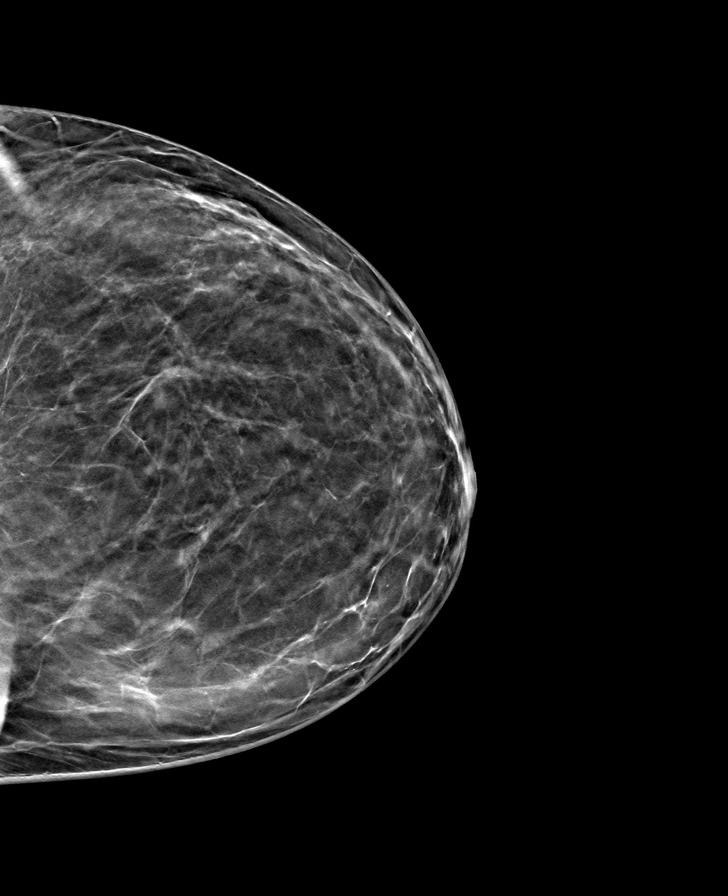

[R CC tomo · tomo slice 35/69.0]
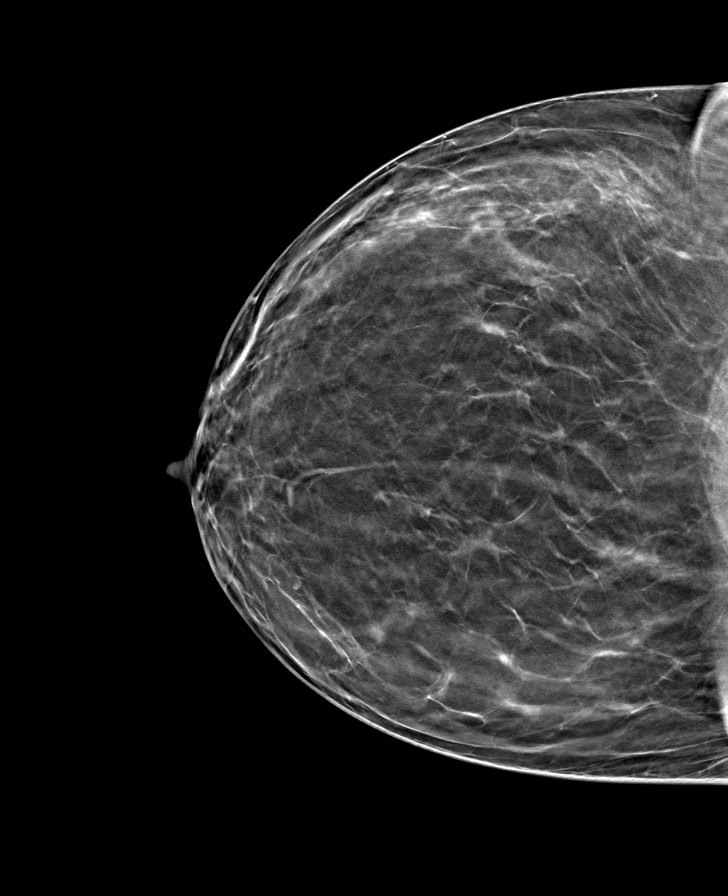

[8 of 24 positions shown; findings below may reference images not displayed]

ACR Breast Density Category b: There are scattered areas of
fibroglandular density.
FINDINGS: There are no findings suspicious for malignancy. Images were
processed with CAD.
IMPRESSION: No mammographic evidence of malignancy. A result letter of this
screening mammogram will be mailed directly to the patient.

RECOMMENDATION:
Screening mammogram in one year. (Code:[id number])

BI-RADS CATEGORY  1: Negative.

## 2019-11-22 ENCOUNTER — Telehealth: Payer: Self-pay | Admitting: Cardiovascular Disease

## 2019-11-22 NOTE — Telephone Encounter (Signed)
° °  Pt would like to speak with Dr. Blenda Mounts nurse she said she just have a question about her BP

## 2019-11-22 NOTE — Telephone Encounter (Signed)
That should come from her PCP or psychiatry if stress is affecting her in that way.

## 2019-11-22 NOTE — Telephone Encounter (Signed)
Advised patient, verbalized understanding  

## 2019-11-22 NOTE — Telephone Encounter (Signed)
Spoke to patient she stated she is under a lot of stress at work.Stated she has started having palpitations again.She would like to take a leave of absence from work.She wanted to know if Dr.Randoph would write a letter.Advised I will send a message to her.

## 2019-12-02 NOTE — Telephone Encounter (Signed)
Pt called in and stated she called her PCP about the Note to write her out and per her PCP since they are not treating her for high BP then they could not write the note.  Pt has an appt Monday with Lurena Joiner to fu on her BP.    Pt stated she is going to upload her BP Log on to [employer]

## 2019-12-03 ENCOUNTER — Telehealth: Payer: BC Managed Care – PPO | Admitting: Cardiology

## 2019-12-09 ENCOUNTER — Telehealth: Payer: BC Managed Care – PPO | Admitting: Cardiology

## 2019-12-09 ENCOUNTER — Other Ambulatory Visit: Payer: Self-pay

## 2019-12-09 ENCOUNTER — Encounter: Payer: Self-pay | Admitting: Cardiology

## 2019-12-10 MED ORDER — NEBIVOLOL HCL 20 MG PO TABS: 20.0000 mg | ORAL_TABLET | Freq: Every day | ORAL | 3 refills | Status: DC

## 2019-12-12 MED ORDER — NEBIVOLOL HCL 20 MG PO TABS
20.0000 mg | ORAL_TABLET | Freq: Every day | ORAL | 3 refills | Status: DC
Start: 2019-12-12 — End: 2020-01-07

## 2019-12-26 ENCOUNTER — Telehealth: Payer: Self-pay | Admitting: Cardiovascular Disease

## 2019-12-26 DIAGNOSIS — Z79899 Other long term (current) drug therapy: Secondary | ICD-10-CM

## 2019-12-26 MED ORDER — DOXAZOSIN MESYLATE 4 MG PO TABS
4.0000 mg | ORAL_TABLET | Freq: Two times a day (BID) | ORAL | 3 refills | Status: DC
Start: 2019-12-26 — End: 2020-01-13

## 2019-12-26 MED ORDER — SPIRONOLACTONE 50 MG PO TABS: 50.0000 mg | ORAL_TABLET | Freq: Every day | ORAL | 1 refills | Status: DC

## 2019-12-26 NOTE — Telephone Encounter (Signed)
Patient returning Hayley's phone call, will await a call back.

## 2019-12-26 NOTE — Telephone Encounter (Signed)
Conveyed Dr. Blenda Mounts orders to pt. Medications updated to reflect cardura 4 mg bid and aldactone 50 mg daily. BMET ordered for 1 week. Follow up appointment scheduled virtually with Almyra Deforest, PA-C for 01/07/20 at 0815 a.m. Pt will maintain BP log through appt. All questions answered. Pt verbalizes understanding.

## 2019-12-26 NOTE — Telephone Encounter (Signed)
Pt is calling today to report elevated blood pressure readings despite increase doses of antihypertensives at last office visit. Pt takes her blood pressure twice a day, on each arm or wrist (has both arm and wrist cuffs at home), and then repeats readings a couple minutes later.  Readings this morning at 0915 were 192/73 and 184/90 on the R Wrist and 186/76 and 156/81 on the L wrist.  Last Night at 2015 R ARM: 163/88, 157/84 and L ARM: 176/88, 152/85  11/09 1730: R Wrist 1730: 138/81, 168/82 and L Wrist: 175/85, 177/84  Pt states she feels well and has no symptoms. Denies feeling lightheaded or dizzy. No headache, no chest pain, no shortness of breath. Pt is exercising comfortably during the day. States she is watching her salt intake. Drinks approximately 88 oz of water per day.

## 2019-12-26 NOTE — Telephone Encounter (Signed)
Attempt to return call-lmtcb

## 2019-12-26 NOTE — Telephone Encounter (Signed)
Increase doxazosin to 4mg  bid and spironolactone to 50mg .  Check BMP in a week.  She needs f/u within 1-2 weeks.  Can be with PharmD or APP if I don't have appointments.

## 2019-12-26 NOTE — Addendum Note (Signed)
Addended by: Leotis Pain, [place] on: 12/26/2019 06:46 PM   Modules accepted: Orders

## 2019-12-26 NOTE — Telephone Encounter (Signed)
Pt c/o BP issue: STAT if pt c/o blurred vision, one-sided weakness or slurred speech  1. What are your last 5 BP readings?  12/26/19 192/73 (right arm) 186/76 (left arm) 184/90 (right arm) 156/81 (left arm) 12/25/19 8:15 PM 163/88 (right arm) 157/84 (right arm) 176/88 (left arm) 152/85 (left arm) 12/24/19 147's Am 138/81 (right arm) 168/82 (right arm) 175/85 (left arm) 177/84 (left arm)  2. Are you having any other symptoms (ex. Dizziness, headache, blurred vision, passed out)? No   3. What is your BP issue? Jesiah is calling stating her BP has been all over the place. She states she has been taking 20 MG's of Bystolic daily as advised for this and the numbers have still been unstable, so she is concerned. Please advise.

## 2020-01-03 DIAGNOSIS — Z79899 Other long term (current) drug therapy: Secondary | ICD-10-CM | POA: Diagnosis not present

## 2020-01-04 LAB — BASIC METABOLIC PANEL
BUN/Creatinine Ratio: 25 (ref 12–28)
BUN: 20 mg/dL (ref 8–27)
CO2: 25 mmol/L (ref 20–29)
Calcium: 9.5 mg/dL (ref 8.7–10.3)
Chloride: 100 mmol/L (ref 96–106)
Creatinine, Ser: 0.79 mg/dL (ref 0.57–1.00)
GFR calc Af Amer: 94 mL/min/{1.73_m2} (ref >59)
GFR calc non Af Amer: 82 mL/min/{1.73_m2} (ref >59)
Glucose: 80 mg/dL (ref 65–99)
Potassium: 4.3 mmol/L (ref 3.5–5.2)
Sodium: 138 mmol/L (ref 134–144)

## 2020-01-07 ENCOUNTER — Encounter: Payer: Self-pay | Admitting: Physician Assistant

## 2020-01-07 ENCOUNTER — Telehealth: Payer: Self-pay

## 2020-01-07 ENCOUNTER — Telehealth (INDEPENDENT_AMBULATORY_CARE_PROVIDER_SITE_OTHER): Payer: BC Managed Care – PPO | Admitting: Physician Assistant

## 2020-01-07 VITALS — BP 130/78 | HR 54 | Ht 63.0 in | Wt 171.0 lb

## 2020-01-07 DIAGNOSIS — E785 Hyperlipidemia, unspecified: Secondary | ICD-10-CM

## 2020-01-07 DIAGNOSIS — I10 Essential (primary) hypertension: Secondary | ICD-10-CM

## 2020-01-07 MED ORDER — NEBIVOLOL HCL 20 MG PO TABS
40.0000 mg | ORAL_TABLET | Freq: Every day | ORAL | 3 refills | Status: DC
Start: 2020-01-07 — End: 2020-01-09

## 2020-01-07 MED ORDER — SPIRONOLACTONE 50 MG PO TABS
50.0000 mg | ORAL_TABLET | Freq: Every day | ORAL | 3 refills | Status: DC
Start: 2020-01-07 — End: 2021-01-25

## 2020-01-07 NOTE — Telephone Encounter (Signed)
  Patient Consent for Virtual Visit         Stephanie White has provided verbal consent on 01/07/2020 for a virtual visit (video or telephone).   CONSENT FOR VIRTUAL VISIT FOR:  Stephanie White  By participating in this virtual visit I agree to the following:  I hereby voluntarily request, consent and authorize [facility] and its employed or contracted physicians, Engineer, materials, nurse practitioners or other licensed health care professionals (the Practitioner), to provide me with telemedicine health care services (the "Services") as deemed necessary by the treating Practitioner. I acknowledge and consent to receive the Services by the Practitioner via telemedicine. I understand that the telemedicine visit will involve communicating with the Practitioner through live audiovisual communication technology and the disclosure of certain medical information by electronic transmission. I acknowledge that I have been given the opportunity to request an in-person assessment or other available alternative prior to the telemedicine visit and am voluntarily participating in the telemedicine visit.  I understand that I have the right to withhold or withdraw my consent to the use of telemedicine in the course of my care at any time, without affecting my right to future care or treatment, and that the Practitioner or I may terminate the telemedicine visit at any time. I understand that I have the right to inspect all information obtained and/or recorded in the course of the telemedicine visit and may receive copies of available information for a reasonable fee.  I understand that some of the potential risks of receiving the Services via telemedicine include:  Marland Kitchen Delay or interruption in medical evaluation due to technological equipment failure or disruption; . Information transmitted may not be sufficient (e.g. poor resolution of images) to allow for appropriate medical decision making by the  Practitioner; and/or  . In rare instances, security protocols could fail, causing a breach of personal health information.  Furthermore, I acknowledge that it is my responsibility to provide information about my medical history, conditions and care that is complete and accurate to the best of my ability. I acknowledge that Practitioner's advice, recommendations, and/or decision may be based on factors not within their control, such as incomplete or inaccurate data provided by me or distortions of diagnostic images or specimens that may result from electronic transmissions. I understand that the practice of medicine is not an exact science and that Practitioner makes no warranties or guarantees regarding treatment outcomes. I acknowledge that a copy of this consent can be made available to me via my patient portal ([facility]), or I can request a printed copy by calling the office of [facility].    I understand that my insurance will be billed for this visit.   I have read or had this consent read to me. . I understand the contents of this consent, which adequately explains the benefits and risks of the Services being provided via telemedicine.  . I have been provided ample opportunity to ask questions regarding this consent and the Services and have had my questions answered to my satisfaction. . I give my informed consent for the services to be provided through the use of telemedicine in my medical care

## 2020-01-07 NOTE — Patient Instructions (Addendum)
Medication Instructions:  Your physician recommends that you continue on your current medications as directed. Please refer to the Current Medication list given to you today.  *If you need a refill on your cardiac medications before your next appointment, please call your pharmacy*  Lab Work: NONE ordered at this time of appointment   If you have labs (blood work) drawn today and your tests are completely normal, you will receive your results only by: Marland Kitchen MyChart Message (if you have MyChart) OR . A paper copy in the mail If you have any lab test that is abnormal or we need to change your treatment, we will call you to review the results.  Testing/Procedures: NONE ordered at this time of appointment   Follow-Up: At [facility], you and your health needs are our priority.  As part of our continuing mission to provide you with exceptional heart care, we have created designated Provider Care Teams.  These Care Teams include your primary Cardiologist (physician) and Advanced Practice Providers (APPs -  Physician Assistants and Nurse Practitioners) who all work together to provide you with the care you need, when you need it.  Your next appointment:   Follow up as scheduled-01/31/20 at 8:20 AM    The format for your next appointment:   In Person  Provider:   Skeet Latch, MD  Other Instructions

## 2020-01-07 NOTE — Progress Notes (Signed)
Virtual Visit via Telephone Note   This visit type was conducted due to national recommendations for restrictions regarding the COVID-19 Pandemic (e.g. social distancing) in an effort to limit this patient's exposure and mitigate transmission in our community.  Due to her co-morbid illnesses, this patient is at least at moderate risk for complications without adequate follow up.  This format is felt to be most appropriate for this patient at this time.  The patient did not have access to video technology/had technical difficulties with video requiring transitioning to audio format only (telephone).  All issues noted in this document were discussed and addressed.  No physical exam could be performed with this format.  Please refer to the patient's chart for her  consent to telehealth for [facility].    Date:  01/07/2020   ID:  Stephanie White, DOB 05/04/1960, MRN [id number] The patient was identified using 2 identifiers.  Patient Location: Home Provider Location: Home Office  PCP:  Lennie Odor, [place]  Cardiologist:  Skeet Latch, MD  Electrophysiologist:  None   Evaluation Performed:  Follow-Up Visit  Chief Complaint:  Hypertension F/U  History of Present Illness:    Stephanie White is a 59 y.o. female with hypertension, hypokalemia, PVCs and hyperlipidemia.  She had a history of palpitations secondary to PVCs in the setting of hypokalemia.  Spironolactone was added to her medical regiment to help with both hypokalemia and blood pressure.  Serum renin and aldosterone level were normal.  Treadmill stress test in May 2017 showed no ischemia however had hypertensive response to exercise.  She was able to achieve 9 METS of activity.  She has lower extremity edema on amlodipine but was able to tolerate 2.5 mg dosage daily.  Renal artery ultrasound was negative for stenosis.  More recently, patient was seen by Dr. Oval Linsey on 11/01/2019 due to elevated blood pressure.  Her  doxazosin was increased to 4 mg daily. Due to elevated blood pressure, her doxazosin was increased to 4 mg twice daily and spironolactone increased to 50 mg daily on 12/26/2019.  Follow-up lab work obtained on 01/03/2020 showed stable renal function and electrolyte.  Patient presents today for virtual visit.  Initially after medication adjustment, her blood pressure remained elevated, however in the past 3 days, her blood pressure has essentially normalized in the 120s range.  She said the jokingly that her manager was on vacation for the past 3 days.  She is feeling well on the current therapy.  She has upcoming vacation which she is quite excited about.  At this time, I would not recommend further adjustment of her blood pressure medication.  We have reviewed the recent lab work.  She has upcoming visit with Dr. Oval Linsey in December.  Otherwise she has no chest pain or significant shortness of breath.  The patient does not have symptoms concerning for COVID-19 infection (fever, chills, cough, or new shortness of breath).    Past Medical History:  Diagnosis Date  . Allergic rhinitis   . Anemia   . HTN (hypertension)   . Hypokalemia   . Leukopenia 01/04/2012  . PVC (premature ventricular contraction) 06/23/2015   Past Surgical History:  Procedure Laterality Date  . BREAST BIOPSY Left 2016  . CESAREAN SECTION     x2  . colonoscopy  2012   reportedly negative per patient  . ENDOMETRIAL ABLATION  12/2009     Current Meds  Medication Sig  . Bacillus Coagulans-Inulin (PROBIOTIC FORMULA) 1-250 BILLION-MG CAPS Take 1 tablet by  mouth daily.  . Calcium-Magnesium-Vitamin D (CALCIUM 1200+D3 PO) Take 1 tablet by mouth daily.  Marland Kitchen doxazosin (CARDURA) 4 MG tablet Take 1 tablet (4 mg total) by mouth 2 (two) times daily.  . Multiple Vitamins-Minerals (ALIVE WOMENS 50+ PO) Take 1 tablet by mouth daily.  . Nebivolol HCl (BYSTOLIC) 20 MG TABS Take 40 mg by mouth daily.  . Omega-3 Fatty Acids (FISH OIL  BURP-LESS PO) Take 1,200 mg by mouth daily.  Marland Kitchen OVER THE COUNTER MEDICATION Take 1 capsule by mouth daily. Beet root  . spironolactone (ALDACTONE) 50 MG tablet Take 1 tablet (50 mg total) by mouth daily.  . Turmeric 500 MG TABS Take 1 tablet by mouth daily.      Allergies:   Amlodipine, Hctz [hydrochlorothiazide], Irbesartan, Lisinopril, Latex, and Tramadol   Social History   Tobacco Use  . Smoking status: Never Smoker  . Smokeless tobacco: Never Used  Substance Use Topics  . Alcohol use: No  . Drug use: No     Family Hx: The patient's family history includes Cancer in her paternal uncle; Cancer (age of onset: 63) in her father; Diabetes in her mother; Heart disease in her brother, mother, and sister; Hypertension in her mother and sister. There is no history of Heart attack or Stroke.  ROS:   Please see the history of present illness.     All other systems reviewed and are negative.   Prior CV studies:   The following studies were reviewed today:  ETT 07/10/2015  Blood pressure demonstrated a hypertensive response to exercise.  There was no ST segment deviation noted during stress.   ETT with fair exercise tolerance (7:21); no chest pain; hypertensive BP response; no diagnostic ST changes; negative adequate ETT.   Labs/Other Tests and Data Reviewed:    EKG:  An ECG dated 11/01/2019 was personally reviewed today and demonstrated:  Sinus rhythm with T wave inversion in the anterior leads.  Recent Labs: 01/03/2020: BUN 20; Creatinine, Ser 0.79; Potassium 4.3; Sodium 138   Recent Lipid Panel Lab Results  Component Value Date/Time   CHOL 153 10/16/2015 07:05 AM   TRIG 151 (H) 10/16/2015 07:05 AM   HDL 40 (L) 10/16/2015 07:05 AM   CHOLHDL 3.8 10/16/2015 07:05 AM   LDLCALC 83 10/16/2015 07:05 AM    Wt Readings from Last 3 Encounters:  01/07/20 171 lb (77.6 kg)  12/09/19 171 lb (77.6 kg)  11/01/19 177 lb 6.4 oz (80.5 kg)     Risk Assessment/Calculations:       Objective:    Vital Signs:  BP 130/78 (BP Location: Left Wrist, Patient Position: Sitting, Cuff Size: Normal)   Pulse (!) 54   Ht 5\' 3"  (1.6 m)   Wt 171 lb (77.6 kg)   BMI 30.29 kg/m    VITAL SIGNS:  reviewed  ASSESSMENT & PLAN:    1. Hypertension: Blood pressure well controlled after the recent blood pressure medication changes.  We will continue on the current therapy.  2. Hyperlipidemia: Continue on fish oil.   Shared Decision Making/Informed Consent        COVID-19 Education: The signs and symptoms of COVID-19 were discussed with the patient and how to seek care for testing (follow up with PCP or arrange E-visit).  The importance of social distancing was discussed today.  Time:   Today, I have spent 18 minutes with the patient with telehealth technology discussing the above problems.     Medication Adjustments/Labs and Tests Ordered: Current medicines are  reviewed at length with the patient today.  Concerns regarding medicines are outlined above.   Tests Ordered: No orders of the defined types were placed in this encounter.   Medication Changes: No orders of the defined types were placed in this encounter.   Follow Up:  In Person in 1 month(s)  Signed, Almyra Deforest, [place]  01/07/2020 8:17 AM    [place]

## 2020-01-09 ENCOUNTER — Other Ambulatory Visit: Payer: Self-pay | Admitting: Physician Assistant

## 2020-01-09 MED ORDER — NEBIVOLOL HCL 20 MG PO TABS: 40.0000 mg | ORAL_TABLET | Freq: Every day | ORAL | 3 refills | Status: DC

## 2020-01-13 ENCOUNTER — Other Ambulatory Visit: Payer: Self-pay | Admitting: Cardiovascular Disease

## 2020-01-13 MED ORDER — DOXAZOSIN MESYLATE 4 MG PO TABS
4.0000 mg | ORAL_TABLET | Freq: Two times a day (BID) | ORAL | 11 refills | Status: DC
Start: 2020-01-13 — End: 2020-01-31

## 2020-01-17 DIAGNOSIS — E785 Hyperlipidemia, unspecified: Secondary | ICD-10-CM | POA: Diagnosis not present

## 2020-01-17 DIAGNOSIS — I10 Essential (primary) hypertension: Secondary | ICD-10-CM | POA: Diagnosis not present

## 2020-01-17 DIAGNOSIS — Z0000 Encounter for general adult medical examination without abnormal findings: Secondary | ICD-10-CM | POA: Diagnosis not present

## 2020-01-31 ENCOUNTER — Ambulatory Visit (INDEPENDENT_AMBULATORY_CARE_PROVIDER_SITE_OTHER): Payer: BC Managed Care – PPO | Admitting: Cardiovascular Disease

## 2020-01-31 ENCOUNTER — Encounter: Payer: Self-pay | Admitting: Cardiovascular Disease

## 2020-01-31 ENCOUNTER — Other Ambulatory Visit: Payer: Self-pay

## 2020-01-31 VITALS — BP 138/80 | HR 55 | Ht 63.0 in | Wt 174.8 lb

## 2020-01-31 DIAGNOSIS — I493 Ventricular premature depolarization: Secondary | ICD-10-CM

## 2020-01-31 DIAGNOSIS — I10 Essential (primary) hypertension: Secondary | ICD-10-CM | POA: Diagnosis not present

## 2020-01-31 MED ORDER — NEBIVOLOL HCL 20 MG PO TABS: 40.0000 mg | ORAL_TABLET | Freq: Every day | ORAL | 5 refills | Status: DC

## 2020-01-31 NOTE — Patient Instructions (Signed)
Medication Instructions:  Your physician recommends that you continue on your current medications as directed. Please refer to the Current Medication list given to you today.  *If you need a refill on your cardiac medications before your next appointment, please call your pharmacy*  Lab Work: NONE  Testing/Procedures: NONE  Follow-Up: At [facility], you and your health needs are our priority.  As part of our continuing mission to provide you with exceptional heart care, we have created designated Provider Care Teams.  These Care Teams include your primary Cardiologist (physician) and Advanced Practice Providers (APPs -  Physician Assistants and Nurse Practitioners) who all work together to provide you with the care you need, when you need it.  We recommend signing up for the patient portal called "MyChart".  Sign up information is provided on this After Visit Summary.  MyChart is used to connect with patients for Virtual Visits (Telemedicine).  Patients are able to view lab/test results, encounter notes, upcoming appointments, etc.  Non-urgent messages can be sent to your provider as well.   To learn more about what you can do with MyChart, go to [email or web address].    Your next appointment:   2 month(s)  The format for your next appointment:   In Person  Provider:   You may see Skeet Latch, MD or one of the following Advanced Practice Providers on your designated Care Team:    Kerin Ransom, PA-C  [place], [place]  Coletta Memos, FNP  Other Instructions SOMEONE FROM THE PREP ([employer]) [place]. IF YOU DO NOT HEAR IN 2 WEEKS CALL THE OFFICE TO FOLLOW UP

## 2020-01-31 NOTE — Progress Notes (Signed)
Cardiology Office Note   Date:  01/31/2020   ID:  Stephanie White, DOB January 06, 1961, MRN [id number]  PCP:  Lennie Odor, PA  Cardiologist:  Skeet Latch, MD  Electrophysiologist:  None   Evaluation Performed:  Follow-Up Visit  Chief Complaint:  Follow up  History of Present Illness:    Stephanie White is a 59 y.o. female with hypertension, hypokalemia, PVCs and hyperlipidemia who presents for follow up.  Stephanie White was initially seen for palpitations. Her palpitations were felt to be due to PVCs in the setting of hypokalemia. Spironolactone was added to her medical regimen to help with both hypokalemia and blood pressure.  Serum renin and aldosterone levels were checked and were both normal.  She was referred for a treadmill stress test 06/2015 that revealed no ischemia but she did have a hypertensive response to exercise.  She exercised for 7 minutes on a Bruce protocol, which is 9 METS. Her peak blood pressure was 163/100.  She developed lower extremity edema on amlodipine but has been able to tolerate 2.5 mg daily.  Stephanie White has a renal artery ultrasound that was negative for stenosis.    At her last appointment her blood pressure remained elevated so doxazosin was increased.  Her blood pressure been elevated so spironolactone was also increased.  She had a follow-up appointment with Almyra Deforest, PA on 12/2019 and her blood pressure was well-controlled.  It was noted that her manager was on vacation and that a lot of her high blood pressure is attributable to work stress.  She decided to retire from her job.  She is planning to start back exercising. Her BP has been much better controlled since she decided to make this decision.  She is excited about retirement and getting more exercise.  She has otherwise been feeling well and has no chest pain or shortness of breath.  She denies lower extremity edema, orthopnea, or PND.   Past Medical History:  Diagnosis Date   . Allergic rhinitis   . Anemia   . HTN (hypertension)   . Hypokalemia   . Leukopenia 01/04/2012  . PVC (premature ventricular contraction) 06/23/2015   Past Surgical History:  Procedure Laterality Date  . BREAST BIOPSY Left 2016  . CESAREAN SECTION     x2  . colonoscopy  2012   reportedly negative per patient  . ENDOMETRIAL ABLATION  12/2009     Current Meds  Medication Sig  . Bacillus Coagulans-Inulin (PROBIOTIC FORMULA) 1-250 BILLION-MG CAPS Take 1 tablet by mouth daily.  . Calcium-Magnesium-Vitamin D (CALCIUM 1200+D3 PO) Take 1 tablet by mouth daily.  Marland Kitchen doxazosin (CARDURA) 4 MG tablet Take 4 mg by mouth in the morning and at bedtime.  . Flaxseed, Linseed, (FLAX SEED OIL) 1000 MG CAPS Take 1,000 mg by mouth daily.  . Multiple Vitamins-Minerals (ALIVE WOMENS 50+ PO) Take 1 tablet by mouth daily.  . Omega-3 Fatty Acids (FISH OIL BURP-LESS PO) Take 1,200 mg by mouth daily.  Marland Kitchen OVER THE COUNTER MEDICATION Take 1 capsule by mouth daily. Beet root  . spironolactone (ALDACTONE) 50 MG tablet Take 1 tablet (50 mg total) by mouth daily.  . Turmeric 500 MG TABS Take 1 tablet by mouth daily.   . [DISCONTINUED] doxazosin (CARDURA) 4 MG tablet Take 1 tablet (4 mg total) by mouth 2 (two) times daily.  . [DISCONTINUED] Nebivolol HCl 20 MG TABS Take 2 tablets (40 mg total) by mouth daily.     Allergies:   Amlodipine, Hctz [hydrochlorothiazide], Irbesartan,  Lisinopril, Latex, and Tramadol   Social History   Tobacco Use  . Smoking status: Never Smoker  . Smokeless tobacco: Never Used  Substance Use Topics  . Alcohol use: No  . Drug use: No     Family Hx: The patient's family history includes Cancer in her paternal uncle; Cancer (age of onset: 36) in her father; Diabetes in her mother; Heart disease in her brother, mother, and sister; Hypertension in her mother and sister. There is no history of Heart attack or Stroke.  ROS:   Please see the history of present illness.     All other  systems reviewed and are negative.   Prior CV studies:   The following studies were reviewed today:  ETT 07/10/15:  Negative for ischemia.  9 METS.  Labs/Other Tests and Data Reviewed:    EKG:  An ECG dated 08/10/17 was personally reviewed today and demonstrated:  sinus bradycardia.  Rate 57 bpm.   11/01/2019: Sinus bradycardia.  Rate 57 bpm.  Nonspecific T wave abnormalities.  Recent Labs: 01/03/2020: BUN 20; Creatinine, Ser 0.79; Potassium 4.3; Sodium 138   Recent Lipid Panel Lab Results  Component Value Date/Time   CHOL 153 10/16/2015 07:05 AM   TRIG 151 (H) 10/16/2015 07:05 AM   HDL 40 (L) 10/16/2015 07:05 AM   CHOLHDL 3.8 10/16/2015 07:05 AM   LDLCALC 83 10/16/2015 07:05 AM   01/17/2020: Total cholesterol 163, triglycerides 177, HDL 36, LDL 96  Wt Readings from Last 3 Encounters:  01/31/20 174 lb 12.8 oz (79.3 kg)  01/07/20 171 lb (77.6 kg)  12/09/19 171 lb (77.6 kg)     Objective:    VS:  BP 138/80 (BP Location: Left Arm, Patient Position: Sitting)   Pulse (!) 55   Ht 5\' 3"  (1.6 m)   Wt 174 lb 12.8 oz (79.3 kg)   SpO2 99%   BMI 30.96 kg/m  , BMI Body mass index is 30.96 kg/m. GENERAL:  Well appearing HEENT: Pupils equal round and reactive, fundi not visualized, oral mucosa unremarkable NECK:  No jugular venous distention, waveform within normal limits, carotid upstroke brisk and symmetric, no bruits LUNGS:  Clear to auscultation bilaterally HEART:  RRR.  PMI not displaced or sustained,S1 and S2 within normal limits, no S3, no S4, no clicks, no rubs, no murmurs ABD:  Flat, positive bowel sounds normal in frequency in pitch, no bruits, no rebound, no guarding, no midline pulsatile mass, no hepatomegaly, no splenomegaly EXT:  2 plus pulses throughout, no edema, no cyanosis no clubbing SKIN:  No rashes no nodules NEURO:  Cranial nerves II through XII grossly intact, motor grossly intact throughout PSYCH:  Cognitively intact, oriented to person place and  time    ASSESSMENT & PLAN:    # Hypertension: # Hypokalemia: Blood pressure is  much better controlled.  It seems as though stress is a big trigger for her.  She also has not been able to get as much exercise due to work changes and has gained a lot of weight during the pandemic.  She is excited to resign from her job and plans to start back with a healthy lifestyle.  We will also refer her to the prep exercise program to the [facility].  Continue nebivolol, doxazosin, and spironolactone.  # PVCs:  No active symptoms.  PVCs much better-controlled on nebivolol. Continue current dose.  She had to stop watching the news.   #CV disease prevention: Lipids were recently checked with her PCP.   Medication  Adjustments/Labs and Tests Ordered: Current medicines are reviewed at length with the patient today.  Concerns regarding medicines are outlined above.   Tests Ordered: No orders of the defined types were placed in this encounter.   Medication Changes: Meds ordered this encounter  Medications  . Nebivolol HCl 20 MG TABS    Sig: Take 2 tablets (40 mg total) by mouth daily.    Dispense:  60 tablet    Refill:  5    Patient will call when needs filled. She will be using [facility]    Follow Up: Virtually with APP in one month. F/u with Yerik Zeringue C. Oval Linsey, MD, [facility] in 2 months  Signed, Skeet Latch, MD  01/31/2020 10:13 AM    [place]

## 2020-02-03 ENCOUNTER — Telehealth: Payer: Self-pay

## 2020-02-03 NOTE — Telephone Encounter (Signed)
Left VMT requesting call back to discuss referral to PREP

## 2020-02-04 ENCOUNTER — Telehealth: Payer: Self-pay

## 2020-02-04 NOTE — Telephone Encounter (Signed)
Call returned to patient reference PREP referral. Explained program, times available, location.  Agreeable to start on Jan 3rd M/W 10am-1115a Intake assessment scheduled for 03/14/20 at [place].  Texted address of [facility] to her for appt.

## 2020-02-12 NOTE — Progress Notes (Signed)
[facility] PREP Progress Report   Patient Details  Name: Stephanie White MRN: [id number] Date of Birth: [birth year] Age: 59 y.o. PCP: Milus Height, PA  Vitals:   02/12/20 1602  BP: (!) 142/78  Pulse: 62  SpO2: 98%  Weight: 178 lb 12.8 oz (81.1 kg)  Height: 5\' 3"  (1.6 m)      [facility] Eval - 02/12/20 1600      Referral    Referring Provider 02/14/20    Reason for referral Hypertension    Program Start Date 03/02/20   M/W 10a/1115a x 12 wks     Measurement   Waist Circumference 41 inches    Hip Circumference 44 inches    Body fat 41.9 percent      Information for Trainer   Goals self care, get BP on track, wt loss goal: 15 lbs    Current Exercise walking 2-3 x a week,has gym at home    Orthopedic Concerns none    Pertinent Medical History HTN, stress    Current Barriers none    Medications that affect exercise Medication causing dizziness/drowsiness      Timed Up and Go (TUGS)   Timed Up and Go Low risk <9 seconds      Mobility and Daily Activities   I find it easy to walk up or down two or more flights of stairs. 4    I have no trouble taking out the trash. 4    I do housework such as vacuuming and dusting on my own without difficulty. 4    I can easily lift a gallon of milk (8lbs). 4    I can easily walk a mile. 4    I have no trouble reaching into high cupboards or reaching down to pick up something from the floor. 4    I do not have trouble doing out-door work such as 03/04/20, raking leaves, or gardening. 4      Mobility and Daily Activities   I feel younger than my age. 4    I feel independent. 4    I feel energetic. 4    I live an active life.  3    I feel strong. 4    I feel healthy. 4    I feel active as other people my age. 4      How fit and strong are you.   Fit and Strong Total Score 55          Past Medical History:  Diagnosis Date  . Allergic rhinitis   . Anemia   . HTN (hypertension)   . Hypokalemia   . Leukopenia  01/04/2012  . PVC (premature ventricular contraction) 06/23/2015   Past Surgical History:  Procedure Laterality Date  . BREAST BIOPSY Left 2016  . CESAREAN SECTION     x2  . colonoscopy  2012   reportedly negative per patient  . ENDOMETRIAL ABLATION  12/2009   Social History   Tobacco Use  Smoking Status Never Smoker  Smokeless Tobacco Never Used   Encouraged to begin journaling for stress management using "Morning Pages" format Focus on breath several times per day    01/2010 02/12/2020, 4:06 PM

## 2020-02-28 ENCOUNTER — Telehealth: Payer: Self-pay

## 2020-02-28 NOTE — Telephone Encounter (Signed)
Call placed to pt reference change of start of PREP class to 2/7 M/W 10am Agreeable to starting then

## 2020-03-18 ENCOUNTER — Telehealth: Payer: Self-pay

## 2020-03-18 NOTE — Telephone Encounter (Signed)
LVMT: PREP will start 03/23/20 10am-1115am.Will meet you at [place] at front desk on Monday.  Pls call for questions [phone]

## 2020-03-23 NOTE — Progress Notes (Signed)
Cardiology Office Note:    Date:  04/03/2020   ID:  Stephanie White, DOB Nov 26, 1960, MRN [id number]  PCP:  Lennie Odor, [place]  Cardiologist:  Skeet Latch, MD  Electrophysiologist:  None   Referring MD: Lennie Odor, PA   Chief Complaint: follow-up of hypertension and PVCs  History of Present Illness:    Stephanie White is a 60 y.o. female with a history of palpitations felt to be due to PVCs, hypertension, and hyperlipidemia who is followed by Dr. Oval Linsey and presents today for routine follow-up.  Patient was initially seen for palpitations in 2017 that were felt to be due to PVCs in setting of hypokalemia. Spironolactone was added to her medical regimen to help with both hypokalemia and BP. Serum renin and aldosterone levels were checked and were both normal. She was referred for a treadmill stress test in 06/2015 which revealed no ischemia but she did have a hypertensive response to exercise. She exercised for 7 minutes with Bruce protocol (9 METS). Renal artery ultrasound was negative for stenosis. BP regimen was adjusted. Some of patient's hypertension was felt to be due to stress. Patient was last seen by Dr. Oval Linsey in 01/2020 at which time she was doing well. Her BP was much better after deciding to retire. She was continued on her current medications which include Doxazosin 4mg  daily, Spironolactone 50mg  daily, and Nebivolol 40mg  daily.   Patient presents today for follow-up. Here alone. She is doing much better since retiring. She started the [place] last week which she is enjoying so far. She denies any cardiac symptoms. No chest pain, shortness of breath, orthopnea, PND, edema, palpitations, lightheadedness, dizziness, near syncope, or syncope.  Patient brings in a very detailed log of her BP. She states her BP has been better since retiring. Systolic BP mostly in the 110 to 130 range but occasionally in the 140's. In early January she had a few very  elevated BP reading with systolic BP in the 884'Z but none of these recently. Lately, she has actually been have some soft BP reading with systolic BP in the 66'A (one reading in the mid 80's). She is asymptomatic with this.  She states she would like to get back on usual dose of medications which were Nebivolol 20mg  daily. Given her current medications seem to be controlling her BP well and she still has some elevated readings at times, I would like to continue her current medications for now. However, we discussed that if she begins to increase her activity and lose weight at the [place] she may not need as many medications in the future.  Past Medical History:  Diagnosis Date  . Allergic rhinitis   . Anemia   . HTN (hypertension)   . Hypokalemia   . Leukopenia 01/04/2012  . PVC (premature ventricular contraction) 06/23/2015    Past Surgical History:  Procedure Laterality Date  . BREAST BIOPSY Left 2016  . CESAREAN SECTION     x2  . colonoscopy  2012   reportedly negative per patient  . ENDOMETRIAL ABLATION  12/2009    Current Medications: Current Meds  Medication Sig  . Bacillus Coagulans-Inulin (PROBIOTIC FORMULA) 1-250 BILLION-MG CAPS Take 1 tablet by mouth daily.  . Calcium-Magnesium-Vitamin D (CALCIUM 1200+D3 PO) Take 1 tablet by mouth daily.  Marland Kitchen doxazosin (CARDURA) 4 MG tablet Take 4 mg by mouth in the morning and at bedtime.  . Flaxseed, Linseed, (FLAX SEED OIL) 1000 MG CAPS Take 1,000 mg by mouth daily.  Marland Kitchen  Multiple Vitamins-Minerals (ALIVE WOMENS 50+ PO) Take 1 tablet by mouth daily.  . Nebivolol HCl 20 MG TABS Take 2 tablets (40 mg total) by mouth daily.  . Omega-3 Fatty Acids (FISH OIL BURP-LESS PO) Take 1,200 mg by mouth daily.  Marland Kitchen OVER THE COUNTER MEDICATION Take 1 capsule by mouth daily. Beet root  . spironolactone (ALDACTONE) 50 MG tablet Take 1 tablet (50 mg total) by mouth daily.  . Turmeric 500 MG TABS Take 1 tablet by mouth daily.      Allergies:    Amlodipine, Hctz [hydrochlorothiazide], Irbesartan, Lisinopril, Latex, and Tramadol   Social History   Socioeconomic History  . Marital status: Married    Spouse name: Not on file  . Number of children: 2  . Years of education: Not on file  . Highest education level: Not on file  Occupational History    Employer: [employer]    Comment: procurement department  Tobacco Use  . Smoking status: Never Smoker  . Smokeless tobacco: Never Used  Substance and Sexual Activity  . Alcohol use: No  . Drug use: No  . Sexual activity: Not on file  Other Topics Concern  . Not on file  Social History Narrative  . Not on file   Social Determinants of Health   Financial Resource Strain: Not on file  Food Insecurity: Not on file  Transportation Needs: Not on file  Physical Activity: Not on file  Stress: Not on file  Social Connections: Not on file     Family History: The patient's family history includes Cancer in her paternal uncle; Cancer (age of onset: 33) in her father; Diabetes in her mother; Heart disease in her brother, mother, and sister; Hypertension in her mother and sister. There is no history of Heart attack or Stroke.  ROS:   Please see the history of present illness.     EKGs/Labs/Other Studies Reviewed:    The following studies were reviewed today:  Exercise Tolerance Test 07/10/2015:  Blood pressure demonstrated a hypertensive response to exercise.  There was no ST segment deviation noted during stress.   ETT with fair exercise tolerance (7:21); no chest pain; hypertensive BP response; no diagnostic ST changes; negative adequate ETT.   EKG:  EKG not ordered today.   Recent Labs: 01/03/2020: BUN 20; Creatinine, Ser 0.79; Potassium 4.3; Sodium 138  Recent Lipid Panel    Component Value Date/Time   CHOL 153 10/16/2015 0705   TRIG 151 (H) 10/16/2015 0705   HDL 40 (L) 10/16/2015 0705   CHOLHDL 3.8 10/16/2015 0705   VLDL 30 10/16/2015 0705   LDLCALC 83  10/16/2015 0705    Physical Exam:    Vital Signs: BP (!) 144/68   Pulse (!) 55   Ht 5\' 3"  (1.6 m)   Wt 179 lb 9.6 oz (81.5 kg)   SpO2 99%   BMI 31.81 kg/m     Wt Readings from Last 3 Encounters:  04/03/20 179 lb 9.6 oz (81.5 kg)  03/30/20 177 lb 9.6 oz (80.6 kg)  02/12/20 178 lb 12.8 oz (81.1 kg)     General: 60 y.o. female in no acute distress. HEENT: Normocephalic and atraumatic. Sclera clear. EOMs intact. Neck: Supple. No carotid bruits. No JVD. Heart: Mildly bradycardic with normal rhythm. Distinct S1 and S2. Soft II/VI murmur. No gallops or rubs. Radial and distal pedal pulses 2+ and equal bilaterally. Lungs: No increased work of breathing. Clear to ausculation bilaterally. No wheezes, rhonchi, or rales.  Abdomen: Soft,  non-distended, and non-tender to palpation.  Extremities: No lower extremity edema.    Skin: Warm and dry. Neuro: Alert and oriented x3. No focal deficits. Psych: Normal affect. Responds appropriately.  Assessment:    1. Primary hypertension   2. PVC's (premature ventricular contractions)   3. Hyperlipidemia, unspecified hyperlipidemia type   4. Murmur     Plan:     Hypertension - BP mildly elevated in the office but mostly well controlled at home. She has had some soft BP readings lately. Will have home BP log scanned in. - Continue current medications for now which include: Doxazosin 4mg  daily, Spironolactone 50mg  daily, and Nebivolol 40mg  daily.  - Patient will continue to monitor BP at home and let us know if she notices her BP dropping and is symptomatic with this.   PVCs - Well controlled on beta-blocker. - Continue Nebivolol 40mg  daily.  Hyperlipidemia - History of hyperlipidemia but not on any medications other than OTC Omega-3. - Recent lipid panel in 01/2020: Total Cholesterol 163, Triglycerides 177, HDL 36, LDL 96.  - Followed by PCP.  Murmur  - Soft murmur noted on exam.  - Patient has never had an Echo so will order  today.  Disposition: Follow up in 6 months with Dr. Oval Linsey or me.   Medication Adjustments/Labs and Tests Ordered: Current medicines are reviewed at length with the patient today.  Concerns regarding medicines are outlined above.  Orders Placed This Encounter  Procedures  . ECHOCARDIOGRAM COMPLETE   No orders of the defined types were placed in this encounter.   Patient Instructions  Medication Instructions:  No Changes *If you need a refill on your cardiac medications before your next appointment, please call your pharmacy*   Lab Work: No Labs If you have labs (blood work) drawn today and your tests are completely normal, you will receive your results only by: Marland Kitchen MyChart Message (if you have MyChart) OR . A paper copy in the mail If you have any lab test that is abnormal or we need to change your treatment, we will call you to review the results.   Testing/Procedures: [address] [place] has requested that you have an echocardiogram. Echocardiography is a painless test that uses sound waves to create images of your heart. It provides your doctor with information about the size and shape of your heart and how well your heart's chambers and valves are working. This procedure takes approximately one hour. There are no restrictions for this procedure.    Follow-Up: At [facility], you and your health needs are our priority.  As part of our continuing mission to provide you with exceptional heart care, we have created designated Provider Care Teams.  These Care Teams include your primary Cardiologist (physician) and Advanced Practice Providers (APPs -  Physician Assistants and Nurse Practitioners) who all work together to provide you with the care you need, when you need it.  Your next appointment:   6 month(s)  The format for your next appointment:   In Person  Provider:   You may see Skeet Latch, MD or one of the following Advanced  Practice Providers on your designated Care Team:    Kerin Ransom, PA-C  [place], [place]  Coletta Memos, FNP        Signed, Darreld Mclean, [place]  04/03/2020 10:21 AM    [place]

## 2020-03-30 NOTE — Progress Notes (Signed)
[facility] PREP Weekly Session   Patient Details  Name: Stephanie White MRN: [id number] Date of Birth: 31-Dec-1960 Age: 60 y.o. PCP: Lennie Odor, PA  Vitals:   03/30/20 1624  BP: 136/77  Weight: 177 lb 9.6 oz (80.6 kg)     [facility] Weekly seesion - 03/30/20 1600      Weekly Session   Topic Discussed Importance of resistance training;Other ways to be active    Minutes exercised this week 420 minutes    Classes attended to date 2          Weekly progress report reviewed   Barnett Hatter 03/30/2020, 4:25 PM

## 2020-04-03 ENCOUNTER — Ambulatory Visit (INDEPENDENT_AMBULATORY_CARE_PROVIDER_SITE_OTHER): Payer: 59 | Admitting: Student

## 2020-04-03 ENCOUNTER — Other Ambulatory Visit: Payer: Self-pay

## 2020-04-03 ENCOUNTER — Encounter: Payer: Self-pay | Admitting: Student

## 2020-04-03 VITALS — BP 144/68 | HR 55 | Ht 63.0 in | Wt 179.6 lb

## 2020-04-03 DIAGNOSIS — R011 Cardiac murmur, unspecified: Secondary | ICD-10-CM | POA: Diagnosis not present

## 2020-04-03 DIAGNOSIS — I10 Essential (primary) hypertension: Secondary | ICD-10-CM | POA: Diagnosis not present

## 2020-04-03 DIAGNOSIS — I493 Ventricular premature depolarization: Secondary | ICD-10-CM

## 2020-04-03 DIAGNOSIS — E785 Hyperlipidemia, unspecified: Secondary | ICD-10-CM

## 2020-04-03 NOTE — Patient Instructions (Signed)
Medication Instructions:  No Changes *If you need a refill on your cardiac medications before your next appointment, please call your pharmacy*   Lab Work: No Labs If you have labs (blood work) drawn today and your tests are completely normal, you will receive your results only by: Marland Kitchen MyChart Message (if you have MyChart) OR . A paper copy in the mail If you have any lab test that is abnormal or we need to change your treatment, we will call you to review the results.   Testing/Procedures: [address] [place] has requested that you have an echocardiogram. Echocardiography is a painless test that uses sound waves to create images of your heart. It provides your doctor with information about the size and shape of your heart and how well your heart's chambers and valves are working. This procedure takes approximately one hour. There are no restrictions for this procedure.    Follow-Up: At [facility], you and your health needs are our priority.  As part of our continuing mission to provide you with exceptional heart care, we have created designated Provider Care Teams.  These Care Teams include your primary Cardiologist (physician) and Advanced Practice Providers (APPs -  Physician Assistants and Nurse Practitioners) who all work together to provide you with the care you need, when you need it.  Your next appointment:   6 month(s)  The format for your next appointment:   In Person  Provider:   You may see Skeet Latch, MD or one of the following Advanced Practice Providers on your designated Care Team:    Kerin Ransom, PA-C  [place], [place]  Coletta Memos, [place]

## 2020-04-08 NOTE — Progress Notes (Signed)
[facility] PREP Weekly Session   Patient Details  Name: Stephanie White MRN: [id number] Date of Birth: [birth year] Age: 60 y.o. PCP: Lennie Odor, PA  Vitals:   04/08/20 1247  BP: 122/62  Weight: 178 lb (80.7 kg)       Pam Tally Joe 04/08/2020, 12:48 PM

## 2020-04-13 NOTE — Progress Notes (Signed)
[facility] PREP Weekly Session   Patient Details  Name: Stephanie White MRN: [id number] Date of Birth: [birth year] Age: 60 y.o. PCP: Lennie Odor, PA  Vitals:   04/13/20 1253  Weight: 178 lb 12.8 oz (81.1 kg)     [facility] Weekly seesion - 04/13/20 1200      Weekly Session   Topic Discussed Health habits;Water    Minutes exercised this week 360 minutes    Classes attended to date [place] 04/13/2020, 12:55 PM

## 2020-04-20 MED ORDER — DOXAZOSIN MESYLATE 4 MG PO TABS: 4.0000 mg | ORAL_TABLET | Freq: Two times a day (BID) | ORAL | 3 refills | Status: DC

## 2020-04-20 NOTE — Progress Notes (Signed)
[facility] PREP Weekly Session   Patient Details  Name: Stephanie White MRN: [id number] Date of Birth: Oct 30, 1960 Age: 60 y.o. PCP: Lennie Odor, PA  Vitals:   04/20/20 1015  BP: 123/68  Weight: 178 lb 3.2 oz (80.8 kg)     [facility] Weekly seesion - 04/20/20 1400      Weekly Session   Topic Discussed Restaurant Eating    Minutes exercised this week 450 minutes    Classes attended to date [place] 04/20/2020, 2:22 PM

## 2020-04-23 ENCOUNTER — Ambulatory Visit (HOSPITAL_COMMUNITY): Payer: 59 | Attending: Cardiology

## 2020-04-23 ENCOUNTER — Other Ambulatory Visit: Payer: Self-pay

## 2020-04-23 DIAGNOSIS — I10 Essential (primary) hypertension: Secondary | ICD-10-CM

## 2020-04-23 DIAGNOSIS — E785 Hyperlipidemia, unspecified: Secondary | ICD-10-CM | POA: Diagnosis not present

## 2020-04-23 DIAGNOSIS — I493 Ventricular premature depolarization: Secondary | ICD-10-CM | POA: Diagnosis not present

## 2020-04-24 LAB — ECHOCARDIOGRAM COMPLETE
Area-P 1/2: 4.19 cm2
S' Lateral: 2.7 cm

## 2020-04-27 NOTE — Progress Notes (Signed)
[facility] PREP Weekly Session   Patient Details  Name: Stephanie White MRN: [id number] Date of Birth: 01-28-1961 Age: 60 y.o. PCP: Lennie Odor, PA  Vitals:   04/27/20 1217  BP: 118/69  Pulse: (!) 58  Weight: 177 lb 6.4 oz (80.5 kg)     [facility] Weekly seesion - 04/27/20 1200      Weekly Session   Topic Discussed Stress management and problem solving    Minutes exercised this week 270 minutes    Classes attended to date [place] 04/27/2020, 12:19 PM

## 2020-05-04 NOTE — Progress Notes (Signed)
[facility] PREP Weekly Session   Patient Details  Name: Daphanie Oquendo MRN: [id number] Date of Birth: [birth year] Age: 60 y.o. PCP: Lennie Odor, PA  Vitals:   05/04/20 1000  BP: 110/62  Weight: 178 lb 6.4 oz (80.9 kg)     [facility] Weekly seesion - 05/04/20 1100      Weekly Session   Topic Discussed Expectations and non-scale victories    Minutes exercised this week 600 minutes    Classes attended to date [place] 05/04/2020, 11:41 AM

## 2020-05-11 NOTE — Progress Notes (Signed)
[facility] PREP Weekly Session   Patient Details  Name: Stephanie White MRN: [id number] Date of Birth: 1960/07/09 Age: 60 y.o. PCP: Lennie Odor, PA  Vitals:   05/11/20 1640  Weight: 179 lb 6.4 oz (81.4 kg)     [facility] Weekly seesion - 05/11/20 1600      Weekly Session   Topic Discussed Other   Portion control   Minutes exercised this week 390 minutes    Classes attended to date [place] 05/11/2020, 4:42 PM

## 2020-05-13 ENCOUNTER — Other Ambulatory Visit: Payer: Self-pay

## 2020-05-13 MED ORDER — NEBIVOLOL HCL 20 MG PO TABS: 40.0000 mg | ORAL_TABLET | Freq: Every day | ORAL | 3 refills | Status: DC

## 2020-05-18 NOTE — Progress Notes (Signed)
[facility] PREP Weekly Session   Patient Details  Name: Stephanie White MRN: [id number] Date of Birth: [birth year] Age: 60 y.o. PCP: Lennie Odor, PA  Vitals:   05/18/20 1000  BP: (!) 108/59  Pulse: 64  Weight: 178 lb 12.8 oz (81.1 kg)     [facility] Weekly seesion - 05/18/20 1100      Weekly Session   Topic Discussed Finding support    Minutes exercised this week 300 minutes    Classes attended to date [place] 05/18/2020, 11:31 AM

## 2020-05-25 NOTE — Progress Notes (Signed)
[facility] PREP Weekly Session   Patient Details  Name: Stephanie White MRN: [id number] Date of Birth: 28-Mar-1960 Age: 60 y.o. PCP: Lennie Odor, PA  Vitals:   05/25/20 1000  BP: 110/65  Pulse: 61  Weight: 179 lb 12.8 oz (81.6 kg)     [facility] Weekly seesion - 05/25/20 1100      Weekly Session   Topic Discussed Calorie breakdown    Minutes exercised this week 260 minutes    Classes attended to date [place] 05/25/2020, 11:40 AM

## 2020-06-01 NOTE — Progress Notes (Signed)
[facility] PREP Weekly Session   Patient Details  Name: Stephanie White MRN: [id number] Date of Birth: 1960/07/12 Age: 60 y.o. PCP: Lennie Odor, PA  Vitals:   06/01/20 1205  Weight: 178 lb 12.8 oz (81.1 kg)     [facility] Weekly seesion - 06/01/20 1200      Weekly Session   Topic Discussed Hitting roadblocks    Minutes exercised this week 360 minutes    Classes attended to date 20            Barnett Hatter 06/01/2020, 12:06 PM

## 2020-06-08 NOTE — Progress Notes (Signed)
[facility] PREP Weekly Session   Patient Details  Name: Stephanie White MRN: [id number] Date of Birth: [birth year] Age: 60 y.o. PCP: Lennie Odor, PA  Vitals:   06/08/20 1144  BP: 123/69  Pulse: (!) 56  Weight: 179 lb 6.4 oz (81.4 kg)     [facility] Weekly seesion - 06/08/20 1100      Weekly Session   Topic Discussed Other   How fit and strong you are survey completed   Minutes exercised this week 330 minutes    Classes attended to date 22            [place] 06/08/2020, 11:45 AM

## 2020-06-16 ENCOUNTER — Other Ambulatory Visit: Payer: Self-pay | Admitting: Obstetrics and Gynecology

## 2020-06-16 DIAGNOSIS — Z1231 Encounter for screening mammogram for malignant neoplasm of breast: Secondary | ICD-10-CM

## 2020-06-16 NOTE — Progress Notes (Signed)
[place] Report   Patient Details  Name: Jaquala Fuller MRN: [id number] Date of Birth: 1960/03/11 Age: 60 y.o. PCP: Lennie Odor, PA  Vitals:   06/15/20 1030  BP: 128/72  Pulse: (!) 55  Weight: 182 lb (82.6 kg)      [facility] Eval - 06/16/20 1300      Measurement   Waist Circumference 39 inches    Hip Circumference 42 inches    Body fat 43.4 percent      Information for Trainer   Goals --   Maintain BP <120/80, wt. loss, increase strength/flexibility     Mobility and Daily Activities   I find it easy to walk up or down two or more flights of stairs. 3    I have no trouble taking out the trash. 4    I do housework such as vacuuming and dusting on my own without difficulty. 4    I can easily lift a gallon of milk (8lbs). 4    I can easily walk a mile. 4    I have no trouble reaching into high cupboards or reaching down to pick up something from the floor. 4    I do not have trouble doing out-door work such as Armed forces logistics/support/administrative officer, raking leaves, or gardening. 4      Mobility and Daily Activities   I feel younger than my age. 3    I feel independent. 4    I feel energetic. 3    I live an active life.  3    I feel strong. 3    I feel healthy. 4    I feel active as other people my age. 4      How fit and strong are you.   Fit and Strong Total Score 51          Past Medical History:  Diagnosis Date  . Allergic rhinitis   . Anemia   . HTN (hypertension)   . Hypokalemia   . Leukopenia 01/04/2012  . PVC (premature ventricular contraction) 06/23/2015   Past Surgical History:  Procedure Laterality Date  . BREAST BIOPSY Left 2016  . CESAREAN SECTION     x2  . colonoscopy  2012   reportedly negative per patient  . ENDOMETRIAL ABLATION  12/2009   Social History   Tobacco Use  Smoking Status Never Smoker  Smokeless Tobacco Never Used        [place] Grima B Wen [place] 06/16/2020, 1:30 PM

## 2020-08-07 ENCOUNTER — Ambulatory Visit
Admission: RE | Admit: 2020-08-07 | Discharge: 2020-08-07 | Disposition: A | Discharge status: Home / Self Care | Payer: 59 | Source: Ambulatory Visit | Attending: Obstetrics and Gynecology | Admitting: Obstetrics and Gynecology

## 2020-08-07 ENCOUNTER — Other Ambulatory Visit: Payer: Self-pay

## 2020-08-07 DIAGNOSIS — Z1231 Encounter for screening mammogram for malignant neoplasm of breast: Secondary | ICD-10-CM

## 2020-08-19 ENCOUNTER — Ambulatory Visit (HOSPITAL_COMMUNITY)
Admission: EM | Admit: 2020-08-19 | Discharge: 2020-08-19 | Disposition: A | Discharge status: Home / Self Care | Payer: 59 | Attending: Physician Assistant | Admitting: Physician Assistant

## 2020-08-19 ENCOUNTER — Encounter (HOSPITAL_COMMUNITY): Payer: Self-pay

## 2020-08-19 ENCOUNTER — Other Ambulatory Visit: Payer: Self-pay

## 2020-08-19 DIAGNOSIS — R52 Pain, unspecified: Secondary | ICD-10-CM | POA: Diagnosis not present

## 2020-08-19 DIAGNOSIS — M546 Pain in thoracic spine: Secondary | ICD-10-CM

## 2020-08-19 MED ORDER — TIZANIDINE HCL 4 MG PO CAPS: 4.0000 mg | ORAL_CAPSULE | Freq: Two times a day (BID) | ORAL | 0 refills | Status: DC | PRN

## 2020-08-19 NOTE — Discharge Instructions (Addendum)
Use tizanidine up to twice a day as needed for muscle pain.  This will make you sleepy so do not drive or drink alcohol with it.  Please alternate Tylenol and ibuprofen for pain relief.  Use heat, rest, stretch for symptom management.  Avoid any strenuous activity.  If symptoms do not improve please return for reevaluation within 1 week.  If you have any worsening symptoms including recurrent shooting pains, nausea, vomiting, chest pain, shortness of breath, or rash you need to be seen immediately.

## 2020-08-19 NOTE — ED Provider Notes (Signed)
[place]    CSN: [id number] Arrival date & time: 08/19/20  0803      History   Chief Complaint Chief Complaint  Patient presents with   scapular pain    HPI Stephanie White is a 60 y.o. female.   Patient presents today with a several day history of intermittent right thoracic back pain.  She denies any known injury prior to symptom onset but does report she was working in the yard before symptoms began and wonders if she could have strained something while pulling weeds.  She has had several episodes of sharp electric shocks.  Most recent episode occurred earlier today and involved thoracic back with radiation around ribs towards chest, described as shooting pain, rated 10 on a 0-10 pain scale, lasted for few seconds and then resolved without intervention.  Following severe pain patient became diaphoretic and had nausea and some diarrhea.  The symptoms then resolved after a few minutes and she denies any current significant symptoms including chest pain, shortness of breath, nausea, diaphoresis, lightheadedness.  She has tried Tylenol with improvement of symptoms.  She denies any previous spinal surgery or injury.  She reports ongoing soreness in affected area that is worse with palpation.  She denies any numbness, shoulder pain, weakness, paresthesias.  She is right-handed.  She does have a history of hypertension but denies additional cardiac risk factors including diabetes, hyperlipidemia, family history of CVD, smoking.   Past Medical History:  Diagnosis Date   Allergic rhinitis    Anemia    HTN (hypertension)    Hypokalemia    Leukopenia 01/04/2012   PVC (premature ventricular contraction) 06/23/2015    Patient Active Problem List   Diagnosis Date Noted   PVC (premature ventricular contraction) 06/23/2015   Sprain of ankle 01/29/2015   Leukopenia 01/04/2012   HTN (hypertension)    Allergic rhinitis    Hypokalemia    Anemia     Past Surgical History:   Procedure Laterality Date   BREAST BIOPSY Left 2016   CESAREAN SECTION     x2   colonoscopy  2012   reportedly negative per patient   ENDOMETRIAL ABLATION  12/2009    OB History   No obstetric history on file.      Home Medications    Prior to Admission medications   Medication Sig Start Date End Date Taking? Authorizing Provider  tiZANidine (ZANAFLEX) 4 MG capsule Take 1 capsule (4 mg total) by mouth 2 (two) times daily as needed for muscle spasms. 08/19/20  Yes Rebekah Zackery K, PA-C  Bacillus Coagulans-Inulin (PROBIOTIC FORMULA) 1-250 BILLION-MG CAPS Take 1 tablet by mouth daily.    [provider]  Calcium-Magnesium-Vitamin D (CALCIUM 1200+D3 PO) Take 1 tablet by mouth daily.    [provider]  doxazosin (CARDURA) 4 MG tablet Take 1 tablet (4 mg total) by mouth 2 (two) times daily. 04/20/20   Skeet Latch, MD  Flaxseed, Linseed, (FLAX SEED OIL) 1000 MG CAPS Take 1,000 mg by mouth daily.    [provider]  Multiple Vitamins-Minerals (ALIVE WOMENS 50+ PO) Take 1 tablet by mouth daily.    [provider]  Nebivolol HCl 20 MG TABS Take 2 tablets (40 mg total) by mouth daily. 05/13/20   Skeet Latch, MD  Omega-3 Fatty Acids (FISH OIL BURP-LESS PO) Take 1,200 mg by mouth daily.    [provider]  OVER THE COUNTER MEDICATION Take 1 capsule by mouth daily. Beet root    [provider]  spironolactone (ALDACTONE) 50 MG tablet Take 1 tablet (50 mg total) by mouth daily. 01/07/20   Almyra Deforest, PA  Turmeric 500 MG TABS Take 1 tablet by mouth daily.     [provider]    Family History Family History  Problem Relation Age of Onset   Hypertension Mother    Diabetes Mother    Heart disease Mother    Cancer Father 29       prostate   Heart disease Sister    Hypertension Sister    Heart disease Brother    Cancer Paternal Uncle        colon   Heart attack Neg Hx    Stroke Neg Hx     Social History Social  History   Tobacco Use   Smoking status: Never   Smokeless tobacco: Never  Vaping Use   Vaping Use: Never used  Substance Use Topics   Alcohol use: No   Drug use: No     Allergies   Amlodipine, Hctz [hydrochlorothiazide], Irbesartan, Lisinopril, Latex, and Tramadol   Review of Systems Review of Systems  Constitutional:  Positive for activity change. Negative for appetite change, fatigue and fever.  Respiratory:  Negative for cough and shortness of breath.   Cardiovascular:  Negative for chest pain, palpitations and leg swelling.  Gastrointestinal:  Negative for abdominal pain, diarrhea, nausea and vomiting.  Musculoskeletal:  Positive for arthralgias, back pain and myalgias.  Neurological:  Negative for dizziness, weakness, light-headedness, numbness and headaches.    Physical Exam Triage Vital Signs ED Triage Vitals  Enc Vitals Group     BP 08/19/20 0831 (!) 169/78     Pulse Rate 08/19/20 0831 (!) 55     Resp 08/19/20 0831 18     Temp 08/19/20 0831 98.7 F (37.1 C)     Temp Source 08/19/20 0831 Oral     SpO2 08/19/20 0831 99 %     Weight --      Height --      Head Circumference --      Peak Flow --      Pain Score 08/19/20 0829 5     Pain Loc --      Pain Edu? --      Excl. in [place]? --    No data found.  Updated Vital Signs BP (!) 169/78 (BP Location: Right Arm)   Pulse (!) 55   Temp 98.7 F (37.1 C) (Oral)   Resp 18   SpO2 99%   Visual Acuity Right Eye Distance:   Left Eye Distance:   Bilateral Distance:    Right Eye Near:   Left Eye Near:    Bilateral Near:     Physical Exam Vitals reviewed.  Constitutional:      General: She is awake. She is not in acute distress.    Appearance: Normal appearance. She is normal weight. She is not ill-appearing.     Comments: Very pleasant female appears stated age no acute distress  HENT:     Head: Normocephalic and atraumatic.  Cardiovascular:     Rate and Rhythm: Normal rate and regular rhythm.     Heart  sounds: Normal heart sounds, S1 normal and S2 normal. No murmur heard. Pulmonary:     Effort: Pulmonary effort is normal.     Breath sounds: Normal breath sounds. No wheezing, rhonchi or rales.     Comments: Clear to auscultation bilaterally Abdominal:     Palpations: Abdomen  is soft.     Tenderness: There is no abdominal tenderness.     Comments: Benign abdominal exam  Musculoskeletal:     Cervical back: Normal. No tenderness or bony tenderness. No pain with movement.     Thoracic back: Spasms and tenderness present. No bony tenderness.     Lumbar back: No tenderness or bony tenderness.       Back:     Comments: No pain percussion of vertebrae.  Significant tenderness palpation and spasms noted right thoracic paraspinal muscles.  No deformity or step-off noted.  Psychiatric:        Behavior: Behavior is cooperative.     UC Treatments / Results  Labs (all labs ordered are listed, but only abnormal results are displayed) Labs Reviewed - No data to display  EKG   Radiology No results found.  Procedures Procedures (including critical care time)  Medications Ordered in UC Medications - No data to display  Initial Impression / Assessment and Plan / UC Course  I have reviewed the triage vital signs and the nursing notes.  Pertinent labs & imaging results that were available during my care of the patient were reviewed by me and considered in my medical decision making (see chart for details).     Pain is reproducible on exam with palpation over thoracic paraspinal muscles.  Suspect shooting pains are related to radiculopathy.  Discussed potential ability of getting an EKG the patient declined this today given symptoms have resolved.  We will treat with muscle relaxer and alternating over-the-counter analgesics.  She was instructed not to drive or drink alcohol with tizanidine as drowsiness is a common side effect.  Encouraged her to use heat, rest, stretch for symptom relief.   She is to follow-up with PCP within 1 week.  Discussed alarm symptoms that warrant emergent evaluation to which patient expressed understanding.  Strict return precautions given which patient expressed understanding.   Final Clinical Impressions(s) / UC Diagnoses   Final diagnoses:  Acute right-sided thoracic back pain  Shooting pain     Discharge Instructions      Use tizanidine up to twice a day as needed for muscle pain.  This will make you sleepy so do not drive or drink alcohol with it.  Please alternate Tylenol and ibuprofen for pain relief.  Use heat, rest, stretch for symptom management.  Avoid any strenuous activity.  If symptoms do not improve please return for reevaluation within 1 week.  If you have any worsening symptoms including recurrent shooting pains, nausea, vomiting, chest pain, shortness of breath, or rash you need to be seen immediately.     ED Prescriptions     Medication Sig Dispense Auth. Provider   tiZANidine (ZANAFLEX) 4 MG capsule Take 1 capsule (4 mg total) by mouth 2 (two) times daily as needed for muscle spasms. 20 capsule Tiarah Shisler, Derry Skill, PA-C      PDMP not reviewed this encounter.   Terrilee Croak, PA-C 08/19/20 [phone]

## 2020-08-19 NOTE — ED Triage Notes (Signed)
Pt reports right scapular pain after working in the yard x 1 day. Pain is worsens with movement.

## 2020-10-12 ENCOUNTER — Other Ambulatory Visit: Payer: Self-pay | Admitting: Obstetrics and Gynecology

## 2020-10-12 DIAGNOSIS — M8588 Other specified disorders of bone density and structure, other site: Secondary | ICD-10-CM

## 2020-10-14 NOTE — Progress Notes (Signed)
 Cardiology Office Note   Date:  10/15/2020   ID:  Stephanie White, DOB 06/18/1960, MRN [id number]  PCP:  Redmon, Noelle, PA  Cardiologist:  Tiffany [place], MD  Electrophysiologist:  None   Evaluation Performed:  Follow-Up Visit  Chief Complaint:  Follow up  History of Present Illness:    Stephanie White is a 59 y.o. female with hypertension, hypokalemia, PVCs and hyperlipidemia who presents for follow up.  Stephanie White was initially seen for palpitations. Her palpitations were felt to be due to PVCs in the setting of hypokalemia. Spironolactone was added to her medical regimen to help with both hypokalemia and blood pressure.  Serum renin and aldosterone levels were checked and were both normal.  She was referred for a treadmill stress test 06/2015 that revealed no ischemia but she did have a hypertensive response to exercise.  She exercised for 7 minutes on a Bruce protocol, which is 9 METS. Her peak blood pressure was 163/100.  She developed lower extremity edema on amlodipine but has been able to tolerate 2.5 mg daily.  Stephanie White has a renal artery ultrasound that was negative for stenosis.    Her blood pressure remained elevated so doxazosin and spironolactone were increased.  She had a follow-up appointment with Hao Meng, PA on 12/2019 and her blood pressure was well-controlled.  Today, she has been feeling great overall. Since retiring her stress has decreased considerably. At home her blood pressure has been well-controlled, and since 03/2020 she is now only taking 1 tablet of nebivolol and doxazosin daily. She has completed PREP which went well, and now goes to GOALS. Her exercise regimen includes zumba, body presses, tai-chi and yoga. Twice a week she assists her neighbor by driving her for errands and grocery shopping. For her diet, she is back on Weight Watchers since May. She reports losing inches off her waist, but is unsure why she is not losing weight. She  denies any palpitations, chest pain, or shortness of breath. No lightheadedness, headaches, syncope, orthopnea, or PND. Also has no lower extremity edema or exertional symptoms.   Past Medical History:  Diagnosis Date   Allergic rhinitis    Anemia    HTN (hypertension)    Hypokalemia    Leukopenia 01/04/2012   PVC (premature ventricular contraction) 06/23/2015   Past Surgical History:  Procedure Laterality Date   BREAST BIOPSY Left 2016   CESAREAN SECTION     x2   colonoscopy  2012   reportedly negative per patient   ENDOMETRIAL ABLATION  12/2009     Current Meds  Medication Sig   Nebivolol HCl (BYSTOLIC) 20 MG TABS Take 1 tablet by mouth daily.     Allergies:   Amlodipine, Hctz [hydrochlorothiazide], Irbesartan, Lisinopril, Latex, and Tramadol   Social History   Tobacco Use   Smoking status: Never   Smokeless tobacco: Never  Vaping Use   Vaping Use: Never used  Substance Use Topics   Alcohol use: No   Drug use: No     Family Hx: The patient's family history includes Cancer in her paternal uncle; Cancer (age of onset: 80) in her father; Diabetes in her mother; Heart disease in her brother, mother, and sister; Hypertension in her mother and sister. There is no history of Heart attack or Stroke.  ROS:   Please see the history of present illness.    All other systems reviewed and are negative.   Prior CV studies:   The following studies were reviewed today:    Echo 04/23/2020: 1. No significant valvular abnormalities.   2. Left ventricular ejection fraction, by estimation, is 60 to 65%. The  left ventricle has normal function. The left ventricle has no regional  wall motion abnormalities. There is mild concentric left ventricular  hypertrophy. Left ventricular diastolic  parameters are consistent with Grade I diastolic dysfunction (impaired  relaxation).   3. Right ventricular systolic function is normal. The right ventricular  size is normal. There is normal  pulmonary artery systolic pressure.   4. The mitral valve is normal in structure. Mild mitral valve  regurgitation. No evidence of mitral stenosis.   5. The aortic valve is normal in structure. Aortic valve regurgitation is  not visualized. No aortic stenosis is present.   6. The inferior vena cava is normal in size with greater than 50%  respiratory variability, suggesting right atrial pressure of 3 mmHg.   Comparison(s): No prior Echocardiogram.   ETT 07/10/15:  Negative for ischemia.  9 METS.  Labs/Other Tests and Data Reviewed:    EKG:   10/15/2020: Sinus bradycardia. Rate 51 bpm. Nonspecific T wave abnormalities. 11/01/2019: Sinus bradycardia.  Rate 57 bpm.  Nonspecific T wave abnormalities. 08/10/2017: Sinus bradycardia. Rate 57 bpm.  Recent Labs: 01/03/2020: BUN 20; Creatinine, Ser 0.79; Potassium 4.3; Sodium 138   Recent Lipid Panel Lab Results  Component Value Date/Time   CHOL 153 10/16/2015 07:05 AM   TRIG 151 (H) 10/16/2015 07:05 AM   HDL 40 (L) 10/16/2015 07:05 AM   CHOLHDL 3.8 10/16/2015 07:05 AM   LDLCALC 83 10/16/2015 07:05 AM   01/17/2020: Total cholesterol 163, triglycerides 177, HDL 36, LDL 96  Wt Readings from Last 3 Encounters:  10/15/20 183 lb 6.4 oz (83.2 kg)  06/15/20 182 lb (82.6 kg)  06/08/20 179 lb 6.4 oz (81.4 kg)     Objective:    VS:  BP 138/78   Pulse (!) 51   Ht 5' 3" (1.6 m)   Wt 183 lb 6.4 oz (83.2 kg)   SpO2 99%   BMI 32.49 kg/m  , BMI Body mass index is 32.49 kg/m. GENERAL:  Well appearing HEENT: Pupils equal round and reactive, fundi not visualized, oral mucosa unremarkable NECK:  No jugular venous distention, waveform within normal limits, carotid upstroke brisk and symmetric, no bruits LUNGS:  Clear to auscultation bilaterally HEART:  RRR.  PMI not displaced or sustained,S1 and S2 within normal limits, no S3, no S4, no clicks, no rubs, no murmurs ABD:  Flat, positive bowel sounds normal in frequency in pitch, no bruits, no  rebound, no guarding, no midline pulsatile mass, no hepatomegaly, no splenomegaly EXT:  2 plus pulses throughout, no edema, no cyanosis no clubbing SKIN:  No rashes no nodules NEURO:  Cranial nerves II through XII grossly intact, motor grossly intact throughout PSYCH:  Cognitively intact, oriented to person place and time   ASSESSMENT & PLAN:   HTN (hypertension) Her blood pressure has been very well have been controlled she has been able to reduce her medications successfully.  She is going to try stopping the doxazosin.  Continue nebivolol 20 mg a day and spironolactone 50 mg daily.  She will keep tracking her pressures and let us know if it is greater than 130/80.  She was congratulated on her excellent lifestyle changes.  PVC (premature ventricular contraction) Well-controlled.  She is doing better now that she has retired from work and is on nebivolol.   Medication Adjustments/Labs and Tests Ordered: Current medicines are reviewed at  length with the patient today.  Concerns regarding medicines are outlined above.   Tests Ordered: Orders Placed This Encounter  Procedures   EKG 12-Lead     Medication Changes: No orders of the defined types were placed in this encounter.   Follow Up: F/u with Adore Kithcart C. Oval Linsey, MD, [facility] in 1 year.  I,Mathew Stumpf,acting as a Education administrator for Skeet Latch, MD.,have documented all relevant documentation on the behalf of Skeet Latch, MD,as directed by  Skeet Latch, MD while in the presence of Skeet Latch, MD.  I, [place] Oval Linsey, MD have reviewed all documentation for this visit.  The documentation of the exam, diagnosis, procedures, and orders on 10/15/2020 are all accurate and complete.   Signed, Skeet Latch, MD  10/15/2020 10:16 AM    [place]

## 2020-10-15 ENCOUNTER — Ambulatory Visit (INDEPENDENT_AMBULATORY_CARE_PROVIDER_SITE_OTHER): Payer: 59 | Admitting: Cardiovascular Disease

## 2020-10-15 ENCOUNTER — Encounter (HOSPITAL_BASED_OUTPATIENT_CLINIC_OR_DEPARTMENT_OTHER): Payer: Self-pay | Admitting: Cardiovascular Disease

## 2020-10-15 ENCOUNTER — Other Ambulatory Visit: Payer: Self-pay

## 2020-10-15 VITALS — BP 138/78 | HR 51 | Ht 63.0 in | Wt 183.4 lb

## 2020-10-15 DIAGNOSIS — I10 Essential (primary) hypertension: Secondary | ICD-10-CM

## 2020-10-15 DIAGNOSIS — I493 Ventricular premature depolarization: Secondary | ICD-10-CM | POA: Diagnosis not present

## 2020-10-15 NOTE — Patient Instructions (Signed)
Medication Instructions:  STOP DOXAZOSIN   *If you need a refill on your cardiac medications before your next appointment, please call your pharmacy*  Lab Work: NONE  Testing/Procedures: NONE  Follow-Up: At [facility], you and your health needs are our priority.  As part of our continuing mission to provide you with exceptional heart care, we have created designated Provider Care Teams.  These Care Teams include your primary Cardiologist (physician) and Advanced Practice Providers (APPs -  Physician Assistants and Nurse Practitioners) who all work together to provide you with the care you need, when you need it.  We recommend signing up for the patient portal called "MyChart".  Sign up information is provided on this After Visit Summary.  MyChart is used to connect with patients for Virtual Visits (Telemedicine).  Patients are able to view lab/test results, encounter notes, upcoming appointments, etc.  Non-urgent messages can be sent to your provider as well.   To learn more about what you can do with MyChart, go to [email or web address].    Your next appointment:   12 month(s)  The format for your next appointment:   In Person  Provider:   Skeet Latch, MD

## 2020-10-15 NOTE — Assessment & Plan Note (Addendum)
Her blood pressure has been very well have been controlled she has been able to reduce her medications successfully.  She is going to try stopping the doxazosin.  Continue nebivolol 20 mg a day and spironolactone 50 mg daily.  She will keep tracking her pressures and let us know if it is greater than 130/80.  She was congratulated on her excellent lifestyle changes.

## 2020-10-15 NOTE — Assessment & Plan Note (Addendum)
Well-controlled.  She is doing better now that she has retired from work and is on nebivolol.

## 2020-11-18 ENCOUNTER — Other Ambulatory Visit: Payer: Self-pay | Admitting: Obstetrics and Gynecology

## 2020-11-18 DIAGNOSIS — M8588 Other specified disorders of bone density and structure, other site: Secondary | ICD-10-CM

## 2020-11-18 DIAGNOSIS — M8580 Other specified disorders of bone density and structure, unspecified site: Secondary | ICD-10-CM

## 2020-11-20 ENCOUNTER — Other Ambulatory Visit: Payer: Self-pay

## 2020-11-20 ENCOUNTER — Ambulatory Visit
Admission: RE | Admit: 2020-11-20 | Discharge: 2020-11-20 | Disposition: A | Discharge status: Home / Self Care | Payer: 59 | Source: Ambulatory Visit | Attending: Obstetrics and Gynecology | Admitting: Obstetrics and Gynecology

## 2020-11-20 DIAGNOSIS — M8588 Other specified disorders of bone density and structure, other site: Secondary | ICD-10-CM

## 2020-11-20 DIAGNOSIS — M8580 Other specified disorders of bone density and structure, unspecified site: Secondary | ICD-10-CM

## 2021-01-23 ENCOUNTER — Other Ambulatory Visit: Payer: Self-pay | Admitting: Physician Assistant

## 2021-06-17 DIAGNOSIS — L570 Actinic keratosis: Secondary | ICD-10-CM | POA: Diagnosis not present

## 2021-06-25 ENCOUNTER — Other Ambulatory Visit: Payer: Self-pay | Admitting: Obstetrics and Gynecology

## 2021-06-25 DIAGNOSIS — Z1231 Encounter for screening mammogram for malignant neoplasm of breast: Secondary | ICD-10-CM

## 2021-08-11 ENCOUNTER — Ambulatory Visit
Admission: RE | Admit: 2021-08-11 | Discharge: 2021-08-11 | Disposition: A | Discharge status: Home / Self Care | Payer: BC Managed Care – PPO | Source: Ambulatory Visit | Attending: Obstetrics and Gynecology | Admitting: Obstetrics and Gynecology

## 2021-08-11 DIAGNOSIS — Z1231 Encounter for screening mammogram for malignant neoplasm of breast: Secondary | ICD-10-CM | POA: Diagnosis not present

## 2021-08-29 ENCOUNTER — Other Ambulatory Visit: Payer: Self-pay | Admitting: Cardiovascular Disease

## 2021-09-03 ENCOUNTER — Other Ambulatory Visit: Payer: Self-pay | Admitting: Cardiovascular Disease

## 2021-10-19 ENCOUNTER — Encounter (HOSPITAL_BASED_OUTPATIENT_CLINIC_OR_DEPARTMENT_OTHER): Payer: Self-pay | Admitting: Cardiovascular Disease

## 2021-10-19 ENCOUNTER — Ambulatory Visit (INDEPENDENT_AMBULATORY_CARE_PROVIDER_SITE_OTHER): Payer: BC Managed Care – PPO | Admitting: Cardiovascular Disease

## 2021-10-19 VITALS — BP 129/71 | HR 48 | Ht 63.0 in | Wt 185.0 lb

## 2021-10-19 DIAGNOSIS — I10 Essential (primary) hypertension: Secondary | ICD-10-CM | POA: Diagnosis not present

## 2021-10-19 DIAGNOSIS — E876 Hypokalemia: Secondary | ICD-10-CM

## 2021-10-19 DIAGNOSIS — I493 Ventricular premature depolarization: Secondary | ICD-10-CM

## 2021-10-19 DIAGNOSIS — Z6832 Body mass index (BMI) 32.0-32.9, adult: Secondary | ICD-10-CM

## 2021-10-19 NOTE — Patient Instructions (Addendum)
Medication Instructions:  Your physician recommends that you continue on your current medications as directed. Please refer to the Current Medication list given to you today.   *If you need a refill on your cardiac medications before your next appointment, please call your pharmacy*  Lab Work: NONE  Testing/Procedures: NONE  Follow-Up: At [facility], you and your health needs are our priority.  As part of our continuing mission to provide you with exceptional heart care, we have created designated Provider Care Teams.  These Care Teams include your primary Cardiologist (physician) and Advanced Practice Providers (APPs -  Physician Assistants and Nurse Practitioners) who all work together to provide you with the care you need, when you need it.  We recommend signing up for the patient portal called "MyChart".  Sign up information is provided on this After Visit Summary.  MyChart is used to connect with patients for Virtual Visits (Telemedicine).  Patients are able to view lab/test results, encounter notes, upcoming appointments, etc.  Non-urgent messages can be sent to your provider as well.   To learn more about what you can do with MyChart, go to [email or web address].    Your next appointment:   12 month(s)  The format for your next appointment:   In Person  Provider:   Skeet Latch, MD    You have been referred to [facility]   Where: [place] Address: [address] [place] [address] Phone: [phone]  IF YOU DO NOT HEAR FROM THEM IN 2 WEEKS CALL THE OFFICE DIRECTLY TO FOLLOW UP

## 2021-10-19 NOTE — Progress Notes (Signed)
Cardiology Office Note   Date:  10/19/2021   ID:  Stephanie White, DOB [birth year], MRN [id number]  PCP:  Lennie Odor, PA  Cardiologist:  Skeet Latch, MD  Electrophysiologist:  None   Evaluation Performed:  Follow-Up Visit  Chief Complaint:  Follow up  History of Present Illness:    Stephanie White is a 61 y.o. female with hypertension, hypokalemia, PVCs and hyperlipidemia who presents for follow up.  Stephanie White was initially seen for palpitations. Her palpitations were felt to be due to PVCs in the setting of hypokalemia. Spironolactone was added to her medical regimen to help with both hypokalemia and blood pressure.  Serum renin and aldosterone levels were checked and were both normal.  She was referred for a treadmill stress test 06/2015 that revealed no ischemia but she did have a hypertensive response to exercise.  She exercised for 7 minutes on a Bruce protocol, which is 9 METS. Her peak blood pressure was 163/100.  She developed lower extremity edema on amlodipine but has been able to tolerate 2.5 mg daily.  Stephanie White has a renal artery ultrasound that was negative for stenosis.   Her blood pressure remained elevated so doxazosin and spironolactone were increased.    At her last visit she is doing well.  She had completed the PREP program and was working on weight loss.  She reports feeling well and has been exercising regularly, attending the gym four days a week with yoga, body pump, and Zumba classes. She states that her blood pressure at home has been consistently good, ranging from 110-120s, and she has verified the accuracy of her home blood pressure machine. However, she experiences higher blood pressure readings during clinic visits. The patient's heart rate is low, but she denies any symptoms of dizziness or lightheadedness. She mentions a recent family loss but does not believe it is affecting her heart rate. She experiences frequent urination,  particularly at night, due to the Spironolactone. She maintains a healthy diet and is actively trying to lose weight, but her trainer suggests that weightlifting and toning may be contributing to her lack of weight loss. The patient had an episode of foot swelling during a recent vacation after driving to [place].  Past Medical History:  Diagnosis Date   Allergic rhinitis    Anemia    HTN (hypertension)    Hypokalemia    Leukopenia 01/04/2012   PVC (premature ventricular contraction) 06/23/2015   Past Surgical History:  Procedure Laterality Date   BREAST BIOPSY Left 2016   CESAREAN SECTION     x2   colonoscopy  2012   reportedly negative per patient   ENDOMETRIAL ABLATION  12/2009     Current Meds  Medication Sig   Bacillus Coagulans-Inulin (PROBIOTIC FORMULA) 1-250 BILLION-MG CAPS Take 1 tablet by mouth daily.   Calcium-Magnesium-Vitamin D (CALCIUM 1200+D3 PO) Take 1 tablet by mouth daily.   Flaxseed, Linseed, (FLAX SEED OIL) 1000 MG CAPS Take 1,000 mg by mouth daily.   Multiple Vitamins-Minerals (ALIVE WOMENS 50+ PO) Take 1 tablet by mouth daily.   Nebivolol HCl 20 MG TABS TAKE 2 TABLETS BY MOUTH EVERY DAY   Omega-3 Fatty Acids (FISH OIL BURP-LESS PO) Take 1,200 mg by mouth daily.   OVER THE COUNTER MEDICATION Take 1 capsule by mouth daily. Beet root   spironolactone (ALDACTONE) 50 MG tablet TAKE 1 TABLET BY MOUTH EVERY DAY   Turmeric 500 MG TABS Take 1 tablet by mouth daily.  Allergies:   Amlodipine, Hctz [hydrochlorothiazide], Irbesartan, Lisinopril, Latex, and Tramadol   Social History   Tobacco Use   Smoking status: Never   Smokeless tobacco: Never  Vaping Use   Vaping Use: Never used  Substance Use Topics   Alcohol use: No   Drug use: No     Family Hx: The patient's family history includes Cancer in her paternal uncle; Cancer (age of onset: 73) in her father; Diabetes in her mother; Heart disease in her brother, mother, and sister; Hypertension in her mother and  sister. There is no history of Heart attack or Stroke.  ROS:   Please see the history of present illness.    All other systems reviewed and are negative.   Prior CV studies:   The following studies were reviewed today:  Echo 04/23/2020: 1. No significant valvular abnormalities.   2. Left ventricular ejection fraction, by estimation, is 60 to 65%. The  left ventricle has normal function. The left ventricle has no regional  wall motion abnormalities. There is mild concentric left ventricular  hypertrophy. Left ventricular diastolic  parameters are consistent with Grade I diastolic dysfunction (impaired  relaxation).   3. Right ventricular systolic function is normal. The right ventricular  size is normal. There is normal pulmonary artery systolic pressure.   4. The mitral valve is normal in structure. Mild mitral valve  regurgitation. No evidence of mitral stenosis.   5. The aortic valve is normal in structure. Aortic valve regurgitation is  not visualized. No aortic stenosis is present.   6. The inferior vena cava is normal in size with greater than 50%  respiratory variability, suggesting right atrial pressure of 3 mmHg.   Comparison(s): No prior Echocardiogram.   ETT 07/10/15:  Negative for ischemia.  9 METS.  Labs/Other Tests and Data Reviewed:    EKG:   10/19/21: Sinus bradycardia.  Bradycardia 48 bpm.  Nonspecific T wave abnormalities. 10/15/2020: Sinus bradycardia. Rate 51 bpm. Nonspecific T wave abnormalities. 11/01/2019: Sinus bradycardia.  Rate 57 bpm.  Nonspecific T wave abnormalities. 08/10/2017: Sinus bradycardia. Rate 57 bpm.  Recent Labs: No results found for requested labs within last 365 days.   Recent Lipid Panel Lab Results  Component Value Date/Time   CHOL 153 10/16/2015 07:05 AM   TRIG 151 (H) 10/16/2015 07:05 AM   HDL 40 (L) 10/16/2015 07:05 AM   CHOLHDL 3.8 10/16/2015 07:05 AM   LDLCALC 83 10/16/2015 07:05 AM   01/17/2020: Total cholesterol 163,  triglycerides 177, HDL 36, LDL 96  Wt Readings from Last 3 Encounters:  10/19/21 185 lb (83.9 kg)  10/15/20 183 lb 6.4 oz (83.2 kg)  06/15/20 182 lb (82.6 kg)     Objective:    VS:  BP 129/71 Comment: home  Pulse (!) 48   Ht '5\' 3"'$  (1.6 m)   Wt 185 lb (83.9 kg)   BMI 32.77 kg/m  , BMI Body mass index is 32.77 kg/m. GENERAL:  Well appearing HEENT: Pupils equal round and reactive, fundi not visualized, oral mucosa unremarkable NECK:  No jugular venous distention, waveform within normal limits, carotid upstroke brisk and symmetric, no bruits, no thyromegaly LUNGS:  Clear to auscultation bilaterally HEART:  RRR.  PMI not displaced or sustained,S1 and S2 within normal limits, no S3, no S4, no clicks, no rubs, no murmurs ABD:  Flat, positive bowel sounds normal in frequency in pitch, no bruits, no rebound, no guarding, no midline pulsatile mass, no hepatomegaly, no splenomegaly EXT:  2 plus pulses  throughout, no edema, no cyanosis no clubbing SKIN:  No rashes no nodules NEURO:  Cranial nerves II through XII grossly intact, motor grossly intact throughout PSYCH:  Cognitively intact, oriented to person place and time    ASSESSMENT & PLAN:   No problem-specific Assessment & Plan notes found for this encounter.  #  Hypertension with superimposed white coat hypertension - Continue Bystolic and Spironolactone as prescribed.  She will try reducing spironolactone to '25mg'$  to see if her BP remains <1350 and if it limits her nocturia. - Monitor blood pressure at home regularly, aiming for systolic pressure below 103 mmHg - No need for doxazosin based on current blood pressure readings  # Obesity:  Difficulty losing weight despite exercise and diet - Refer to healthy weight and wellness for further evaluation and recommendations - Continue diet and exercise  # Pedal edema during vacation - Likely related to prolonged sitting during travel and possible sodium intake - Use compression socks  during future travels to prevent swelling - Monitor for recurrence and report any persistent or worsening edema  # Bradycardia:  Asymptomatic.  OK to continue nebivolol.   Medication Adjustments/Labs and Tests Ordered: Current medicines are reviewed at length with the patient today.  Concerns regarding medicines are outlined above.   Tests Ordered: Orders Placed This Encounter  Procedures   Ambulatory referral to [facility]   EKG 12-Lead     Medication Changes: No orders of the defined types were placed in this encounter.    Follow Up: F/u with Martinez Boxx C. Oval Linsey, MD, [facility] in 1 year.     Signed, Skeet Latch, MD  10/19/2021 8:33 AM    [place]

## 2021-10-20 ENCOUNTER — Other Ambulatory Visit: Payer: Self-pay

## 2021-10-20 ENCOUNTER — Telehealth: Payer: Self-pay

## 2021-10-20 ENCOUNTER — Ambulatory Visit (HOSPITAL_BASED_OUTPATIENT_CLINIC_OR_DEPARTMENT_OTHER): Payer: BC Managed Care – PPO | Admitting: Cardiovascular Disease

## 2021-10-20 NOTE — Telephone Encounter (Signed)
**Note De-Identified Stephanie White Obfuscation** Urgent Nebivolol PA started through covermymeds per the pts request Stephanie White her [facility] message from this morning. Key: [id number]

## 2021-10-20 NOTE — Telephone Encounter (Signed)
**Note De-Identified Stephanie White Obfuscation** Following message received Stephanie White covermymeds:  Stephanie White (Key: [id number])  This request has received a Unfavorable outcome.  Please see letter faxed to your office for details on this adverse benefit determination.  The denial letter will most likely be faxed to Stephanie White at our [facility] office. Forwarding this message to their triage pool to be on the look out for it so I can appeal the denial.

## 2021-10-21 NOTE — Telephone Encounter (Signed)
**Note De-Identified Darik Massing Obfuscation** Letter received Stephanie White fax from [facility] Stephanie White stating that they have denied coverage of Nebivolol. Reason: Nebivolol is a non-formulary medication not covered by plan unless the pt has tired and failed ALL formulary alternative which include Atenolol, Bisoprolol, Metoprolol, etc.  The only formulary alternative the pt has tried and failed was Carvedilol.   Forwarding this message to Stephanie White, her nurse, and the [facility] triage pool for advisement to the pt.

## 2021-10-22 ENCOUNTER — Ambulatory Visit (HOSPITAL_BASED_OUTPATIENT_CLINIC_OR_DEPARTMENT_OTHER): Payer: BC Managed Care – PPO | Admitting: Cardiovascular Disease

## 2021-10-25 MED ORDER — BISOPROLOL FUMARATE 10 MG PO TABS: 10.0000 mg | ORAL_TABLET | Freq: Every day | ORAL | 3 refills | Status: DC

## 2021-10-25 NOTE — Telephone Encounter (Signed)
See mychart message in phone note, patient has read

## 2021-11-03 DIAGNOSIS — Z1389 Encounter for screening for other disorder: Secondary | ICD-10-CM | POA: Diagnosis not present

## 2021-11-03 DIAGNOSIS — Z130 Encounter for screening for diseases of the blood and blood-forming organs and certain disorders involving the immune mechanism: Secondary | ICD-10-CM | POA: Diagnosis not present

## 2021-11-03 DIAGNOSIS — Z0142 Encounter for cervical smear to confirm findings of recent normal smear following initial abnormal smear: Secondary | ICD-10-CM | POA: Diagnosis not present

## 2021-11-03 DIAGNOSIS — Z01419 Encounter for gynecological examination (general) (routine) without abnormal findings: Secondary | ICD-10-CM | POA: Diagnosis not present

## 2021-11-03 DIAGNOSIS — Z1151 Encounter for screening for human papillomavirus (HPV): Secondary | ICD-10-CM | POA: Diagnosis not present

## 2021-11-03 DIAGNOSIS — Z124 Encounter for screening for malignant neoplasm of cervix: Secondary | ICD-10-CM | POA: Diagnosis not present

## 2021-11-03 DIAGNOSIS — Z6832 Body mass index (BMI) 32.0-32.9, adult: Secondary | ICD-10-CM | POA: Diagnosis not present

## 2021-11-24 ENCOUNTER — Ambulatory Visit (INDEPENDENT_AMBULATORY_CARE_PROVIDER_SITE_OTHER): Payer: BC Managed Care – PPO | Admitting: Family Medicine

## 2021-11-24 ENCOUNTER — Encounter (INDEPENDENT_AMBULATORY_CARE_PROVIDER_SITE_OTHER): Payer: Self-pay | Admitting: Family Medicine

## 2021-11-24 VITALS — BP 151/81 | HR 54 | Temp 98.2°F | Ht 63.0 in | Wt 183.0 lb

## 2021-11-24 DIAGNOSIS — I517 Cardiomegaly: Secondary | ICD-10-CM

## 2021-11-24 DIAGNOSIS — Z6832 Body mass index (BMI) 32.0-32.9, adult: Secondary | ICD-10-CM

## 2021-11-24 DIAGNOSIS — I10 Essential (primary) hypertension: Secondary | ICD-10-CM | POA: Diagnosis not present

## 2021-11-24 DIAGNOSIS — E669 Obesity, unspecified: Secondary | ICD-10-CM

## 2021-11-24 DIAGNOSIS — Z0289 Encounter for other administrative examinations: Secondary | ICD-10-CM

## 2021-11-29 NOTE — Progress Notes (Unsigned)
Chief Complaint:   OBESITY  Today's visit was #: 1 Starting weight: 183 lbs Starting date: 11/24/2021 Today's weight: 183 lbs Today's date: 11/24/2021 Total lbs lost to date: 0 Total lbs lost since last in-office visit: 0  Interim History: Stephanie White was referred by Dr. Oval Linsey. She is exercising 4 times per week with strengthening and cardio. She works with a Clinical research associate. She tries to avoid fast food and eat healthier but she has not been losing weight. She drinks a lot of water. She seems to gain more weight with menopause. She didn't used to struggle with weight loss.   Subjective:   1. Mild concentric left ventricular hypertrophy (LVH) Stephanie White's echocardiogram was done at Cardiology, and it shows mild left ventricular hypertrophy with EF of 55%. It also showed stage I diastolic dysfunction.   2. Primary hypertension Stephanie White tends to have elevated blood pressure at her doctor's offices, but it is more controlled at home. She is hoping to continue to improve her blood pressure with decreased medications.   Assessment/Plan:   1. Mild concentric left ventricular hypertrophy (LVH) Stephanie White was given the food, social, and emotional self-evaluation paperwork to fill out today and we will review in depth at her next visit.   2. Primary hypertension Stephanie White will continue her medications as is, and we will check labs and follow-up in 2 weeks.   - CMP14+EGFR  3. Obesity with current BMI of 32.5 Stephanie White will benefit from a nutritional evaluation and appropriate metabolic diet plan.   Exercise goals: As is.   Stephanie White has agreed to follow-up with our clinic in 2 weeks. She was informed of the importance of frequent follow-up visits to maximize her success with intensive lifestyle modifications for her multiple health conditions.   Stephanie White was informed we would discuss her lab results at her next visit unless there is a critical issue that needs to be addressed sooner. Stephanie White agreed to keep her  next visit at the agreed upon time to discuss these results.  Objective:   Blood pressure (!) 151/81, pulse (!) 54, temperature 98.2 F (36.8 C), height _0  (1.6 m), weight 183 lb (83 kg), SpO2 98 %. Body mass index is 32.42 kg/m.  General: Cooperative, alert, well developed, in no acute distress. HEENT: Conjunctivae and lids unremarkable. Cardiovascular: Regular rhythm.  Lungs: Normal work of breathing. Neurologic: No focal deficits.   Lab Results  Component Value Date   CREATININE 0.79 01/03/2020   BUN 20 01/03/2020   NA 138 01/03/2020   K 4.3 01/03/2020   CL 100 01/03/2020   CO2 25 01/03/2020   Lab Results  Component Value Date   ALT 21 10/16/2015   AST 19 10/16/2015   ALKPHOS 54 10/16/2015   BILITOT 0.4 10/16/2015   No results found for: "HGBA1C" No results found for: "INSULIN" No results found for: "TSH" Lab Results  Component Value Date   CHOL 153 10/16/2015   HDL 40 (L) 10/16/2015   LDLCALC 83 10/16/2015   TRIG 151 (H) 10/16/2015   CHOLHDL 3.8 10/16/2015   No results found for: "VD25OH" Lab Results  Component Value Date   WBC 3.2 (L) 10/16/2015   HGB 12.4 10/16/2015   HCT 37.5 10/16/2015   MCV 86.6 10/16/2015   PLT 235 10/16/2015   Lab Results  Component Value Date   IRON 35 (L) 07/04/2011   TIBC 330 07/04/2011   FERRITIN 40 07/04/2011   Attestation Statements:   Reviewed by clinician on day of visit:  allergies, medications, problem list, medical history, surgical history, family history, social history, and previous encounter notes.   I, Trixie Dredge, am acting as transcriptionist for Dennard Nip, MD.  I have reviewed the above documentation for accuracy and completeness, and I agree with the above. -  Dennard Nip, MD

## 2021-11-30 ENCOUNTER — Encounter (INDEPENDENT_AMBULATORY_CARE_PROVIDER_SITE_OTHER): Payer: 59 | Admitting: Family Medicine

## 2021-12-16 ENCOUNTER — Encounter (INDEPENDENT_AMBULATORY_CARE_PROVIDER_SITE_OTHER): Payer: Self-pay | Admitting: Family Medicine

## 2021-12-16 ENCOUNTER — Ambulatory Visit (INDEPENDENT_AMBULATORY_CARE_PROVIDER_SITE_OTHER): Payer: BC Managed Care – PPO | Admitting: Family Medicine

## 2021-12-16 VITALS — BP 134/74 | HR 55 | Temp 97.8°F | Ht 63.0 in | Wt 181.0 lb

## 2021-12-16 DIAGNOSIS — Z1331 Encounter for screening for depression: Secondary | ICD-10-CM

## 2021-12-16 DIAGNOSIS — R739 Hyperglycemia, unspecified: Secondary | ICD-10-CM | POA: Diagnosis not present

## 2021-12-16 DIAGNOSIS — Z6832 Body mass index (BMI) 32.0-32.9, adult: Secondary | ICD-10-CM

## 2021-12-16 DIAGNOSIS — R5383 Other fatigue: Secondary | ICD-10-CM

## 2021-12-16 DIAGNOSIS — R0602 Shortness of breath: Secondary | ICD-10-CM | POA: Diagnosis not present

## 2021-12-16 DIAGNOSIS — I10 Essential (primary) hypertension: Secondary | ICD-10-CM | POA: Diagnosis not present

## 2021-12-16 DIAGNOSIS — E559 Vitamin D deficiency, unspecified: Secondary | ICD-10-CM | POA: Diagnosis not present

## 2021-12-16 DIAGNOSIS — E669 Obesity, unspecified: Secondary | ICD-10-CM

## 2021-12-17 LAB — LIPID PANEL WITH LDL/HDL RATIO
Cholesterol, Total: 179 mg/dL (ref 100–199)
HDL: 39 mg/dL — ABNORMAL LOW (ref >39)
LDL Chol Calc (NIH): 116 mg/dL — ABNORMAL HIGH (ref 0–99)
LDL/HDL Ratio: 3 ratio (ref 0.0–3.2)
Triglycerides: 136 mg/dL (ref 0–149)
VLDL Cholesterol Cal: 24 mg/dL (ref 5–40)

## 2021-12-17 LAB — VITAMIN B12: Vitamin B-12: 770 pg/mL (ref 232–1245)

## 2021-12-17 LAB — FOLATE: Folate: >20 ng/mL (ref >3.0)

## 2021-12-17 LAB — THYROID PANEL WITH TSH
Free Thyroxine Index: 2.3 (ref 1.2–4.9)
T3 Uptake Ratio: 32 % (ref 24–39)
T4, Total: 7.3 ug/dL (ref 4.5–12.0)
TSH: 1.94 u[IU]/mL (ref 0.450–4.500)

## 2021-12-17 LAB — INSULIN, RANDOM: INSULIN: 7.3 u[IU]/mL (ref 2.6–24.9)

## 2021-12-17 LAB — HEMOGLOBIN A1C
Est. average glucose Bld gHb Est-mCnc: 117 mg/dL
Hgb A1c MFr Bld: 5.7 % — ABNORMAL HIGH (ref 4.8–5.6)

## 2021-12-17 LAB — VITAMIN D 25 HYDROXY (VIT D DEFICIENCY, FRACTURES): Vit D, 25-Hydroxy: 39.8 ng/mL (ref 30.0–100.0)

## 2021-12-27 NOTE — Progress Notes (Signed)
Chief Complaint:   OBESITY Stephanie White (MR# [id number]) is a 61 y.o. female who presents for evaluation and treatment of obesity and related comorbidities. Current BMI is Body mass index is 32.06 kg/m. Stephanie White and has been unsuccessful in either losing weight, maintaining weight loss, or reaching her healthy weight goal.  Dr. Migdalia Dk info session patient. BMI increased from 27--32 from 2019 to today. Previously tried [employer] (when patient was walking) and personal training (few White ago). Ginger shot in am. Oatmeal Stephanie White) with almond milk+ sprinkle of brown+ 4 boiled egg whites (satisfied). Lunch--salmon and salad with 2 tbsp dressing(avocado, bell peppers, cucumber, tomatoes). Dinner (5:30/6pm) [place] drumstick with brown rice 1/3 cup +black eyed peas. Snacks--apple, 3 wafers.  Stephanie White is currently in the action stage of change and ready to dedicate time achieving and maintaining a healthier weight. Stephanie White is interested in becoming our patient and working on intensive lifestyle modifications including (but not limited to) diet and exercise for weight loss.  Stephanie White's habits were reviewed today and are as follows: Her family eats meals together, she thinks her family will eat healthier with her, her desired weight loss is 18 lbs, she started gaining weight 2022, her heaviest weight ever was 192 pounds, she has significant food cravings issues, and she has binge eating behaviors.  Depression Screen [name]'s Food and Mood (modified PHQ-9) score was 1.      No data to display         Subjective:   1. Other fatigue Stephanie White admits to daytime somnolence and denies waking up still tired. Patient has a history of symptoms of morning fatigue. Stephanie White generally gets 8 hours of sleep per night, and states that she has generally restful sleep. Snoring is not present. Apneic episodes are not present. Epworth Sleepiness Score is 8.     2. SOB (shortness of breath) on exertion EKG on 10/19/21--Sinus bradycardia. [name] notes increasing shortness of breath with exercising and seems to be worsening over time with weight gain. She notes getting out of breath sooner with activity than she used to. This has not gotten worse recently. [name] denies shortness of breath at rest or orthopnea.   3. Essential hypertension Blood pressure controlled today. [name] is on Bisoprolol, Spironolactone. Previously on Lisinopril, Amlodipine, Doxazosin, Carvedilol.  4. Elevated blood sugar Stephanie White's blood sugar slightly elevated in the past. Only A1c in Epic of 5.2 in 2019.  5. Vitamin D deficiency Only Vit D level of 42 in 2019. Stephanie White is on Vit supplement today.  6. Depression screening Stephanie White is struggling with emotional eating and using food for comfort to the extent that it is negatively impacting her health. She has been working on behavior modification techniques to help reduce her emotional eating and has been very successful. She shows no signs of suicidal or homicidal ideations.   Assessment/Plan:   1. Other fatigue We will obtain labs today. [name] does feel that her weight is causing her energy to be lower than it should be. Fatigue may be related to obesity, depression or many other causes. Labs will be ordered, and in the meanwhile, Stephanie White will focus on self care including making healthy food choices, increasing physical activity and focusing on stress reduction.   - Vitamin B12 - Folate - Thyroid Panel With TSH  2. SOB (shortness of breath) on exertion We will obtain labs today. Stephanie White does feel that she gets out of breath more easily  that she used to when she exercises. Stephanie White's shortness of breath appears to be obesity related and exercise induced. She has agreed to work on weight loss and gradually increase exercise to treat her exercise induced shortness of breath. Will continue to monitor closely.   - Lipid Panel With  LDL/HDL Ratio  3. Essential hypertension We will obtain labs today.  4. Elevated blood sugar We will obtain labs today.  - Hemoglobin A1c - Insulin, random  5. Vitamin D deficiency We will obtain labs today.  - VITAMIN D 25 Hydroxy (Vit-D Deficiency, Fractures)  6. Depression screening Behavior modification techniques were discussed today to help Stephanie White deal with her emotional/non-hunger eating behaviors.  Orders and follow up as documented in patient record.    7. Obesity with current BMI of 32.1 Stephanie White is currently in the action stage of change and her goal is to continue with weight loss efforts. I recommend [name] begin the structured treatment plan as follows:  She has agreed to the Category 2 Plan.  Exercise goals: As is.   Behavioral modification strategies: increasing lean protein intake, meal planning and cooking strategies, and keeping healthy foods in the home.  She was informed of the importance of frequent follow-up visits to maximize her success with intensive lifestyle modifications for her multiple health conditions. She was informed we would discuss her lab results at her next visit unless there is a critical issue that needs to be addressed sooner. Stephanie White agreed to keep her next visit at the agreed upon time to discuss these results.  Objective:   Blood pressure 134/74, pulse (!) 55, temperature 97.8 F (36.6 C), height '5\' 3"'$  (1.6 m), weight 181 lb (82.1 kg), SpO2 99 %. Body mass index is 32.06 kg/m.  EKG: Normal sinus rhythm, rate 48 bpm.  Indirect Calorimeter completed today shows a VO2 of 182 ml and a REE of 1253.  Her calculated basal metabolic rate is 9211 thus her basal metabolic rate is worse than expected.  General: Cooperative, alert, well developed, in no acute distress. HEENT: Conjunctivae and lids unremarkable. Cardiovascular: Regular rhythm.  Lungs: Normal work of breathing. Neurologic: No focal deficits.   Lab Results  Component Value  Date   CREATININE 0.79 01/03/2020   BUN 20 01/03/2020   NA 138 01/03/2020   K 4.3 01/03/2020   CL 100 01/03/2020   CO2 25 01/03/2020   Lab Results  Component Value Date   ALT 21 10/16/2015   AST 19 10/16/2015   ALKPHOS 54 10/16/2015   BILITOT 0.4 10/16/2015   Lab Results  Component Value Date   HGBA1C 5.7 (H) 12/16/2021   Lab Results  Component Value Date   INSULIN 7.3 12/16/2021   Lab Results  Component Value Date   TSH 1.940 12/16/2021   Lab Results  Component Value Date   CHOL 179 12/16/2021   HDL 39 (L) 12/16/2021   LDLCALC 116 (H) 12/16/2021   TRIG 136 12/16/2021   CHOLHDL 3.8 10/16/2015   Lab Results  Component Value Date   WBC 3.2 (L) 10/16/2015   HGB 12.4 10/16/2015   HCT 37.5 10/16/2015   MCV 86.6 10/16/2015   PLT 235 10/16/2015   Lab Results  Component Value Date   IRON 35 (L) 07/04/2011   TIBC 330 07/04/2011   FERRITIN 40 07/04/2011   Attestation Statements:   Reviewed by clinician on day of visit: allergies, medications, problem list, medical history, surgical history, family history, social history, and previous encounter notes.  Time  spent on visit including pre-visit chart review and post-visit charting and care was 45 minutes.   I, Elnora Morrison, RMA am acting as transcriptionist for Coralie Common, MD. This is the patient's first visit at [facility]. The patient's NEW PATIENT PACKET was reviewed at length. Included in the packet: current and past health history, medications, allergies, ROS, gynecologic history (women only), surgical history, family history, social history, weight history, weight loss surgery history (for those that have had weight loss surgery), nutritional evaluation, mood and food questionnaire, PHQ9, Epworth questionnaire, sleep habits questionnaire, patient life and health improvement goals questionnaire. These will all be scanned into the patient's chart under media.   During the visit, I  independently reviewed the patient's EKG, bioimpedance scale results, and indirect calorimeter results. I used this information to tailor a meal plan for the patient that will help her to lose weight and will improve her obesity-related conditions going forward. I performed a medically necessary appropriate examination and/or evaluation. I discussed the assessment and treatment plan with the patient. The patient was provided an opportunity to ask questions and all were answered. The patient agreed with the plan and demonstrated an understanding of the instructions. Labs were ordered at this visit and will be reviewed at the next visit unless more critical results need to be addressed immediately. Clinical information was updated and documented in the EMR.     I have reviewed the above documentation for accuracy and completeness, and I agree with the above. - Coralie Common, MD

## 2021-12-30 ENCOUNTER — Ambulatory Visit (INDEPENDENT_AMBULATORY_CARE_PROVIDER_SITE_OTHER): Payer: BC Managed Care – PPO | Admitting: Family Medicine

## 2021-12-30 ENCOUNTER — Encounter (INDEPENDENT_AMBULATORY_CARE_PROVIDER_SITE_OTHER): Payer: Self-pay | Admitting: Family Medicine

## 2021-12-30 VITALS — BP 138/67 | HR 50 | Temp 97.9°F | Ht 63.0 in | Wt 180.0 lb

## 2021-12-30 DIAGNOSIS — E559 Vitamin D deficiency, unspecified: Secondary | ICD-10-CM | POA: Diagnosis not present

## 2021-12-30 DIAGNOSIS — R7303 Prediabetes: Secondary | ICD-10-CM | POA: Diagnosis not present

## 2021-12-30 DIAGNOSIS — Z6832 Body mass index (BMI) 32.0-32.9, adult: Secondary | ICD-10-CM

## 2021-12-30 DIAGNOSIS — E669 Obesity, unspecified: Secondary | ICD-10-CM

## 2021-12-30 DIAGNOSIS — E7849 Other hyperlipidemia: Secondary | ICD-10-CM

## 2022-01-11 ENCOUNTER — Telehealth (INDEPENDENT_AMBULATORY_CARE_PROVIDER_SITE_OTHER): Payer: Self-pay | Admitting: Family Medicine

## 2022-01-11 ENCOUNTER — Encounter (INDEPENDENT_AMBULATORY_CARE_PROVIDER_SITE_OTHER): Payer: Self-pay

## 2022-01-11 NOTE — Telephone Encounter (Signed)
Patient wanted to know if her labs were sent over to [facility] Please call patient to confirm.

## 2022-01-13 ENCOUNTER — Ambulatory Visit (INDEPENDENT_AMBULATORY_CARE_PROVIDER_SITE_OTHER): Payer: BC Managed Care – PPO | Admitting: Family Medicine

## 2022-01-13 ENCOUNTER — Other Ambulatory Visit: Payer: Self-pay | Admitting: Physician Assistant

## 2022-01-13 ENCOUNTER — Encounter (INDEPENDENT_AMBULATORY_CARE_PROVIDER_SITE_OTHER): Payer: Self-pay | Admitting: Family Medicine

## 2022-01-13 VITALS — BP 129/70 | HR 52 | Temp 98.2°F | Ht 63.0 in | Wt 182.0 lb

## 2022-01-13 DIAGNOSIS — Z6832 Body mass index (BMI) 32.0-32.9, adult: Secondary | ICD-10-CM | POA: Diagnosis not present

## 2022-01-13 DIAGNOSIS — E669 Obesity, unspecified: Secondary | ICD-10-CM | POA: Diagnosis not present

## 2022-01-13 DIAGNOSIS — R7303 Prediabetes: Secondary | ICD-10-CM

## 2022-01-14 DIAGNOSIS — K648 Other hemorrhoids: Secondary | ICD-10-CM | POA: Diagnosis not present

## 2022-01-14 DIAGNOSIS — Z09 Encounter for follow-up examination after completed treatment for conditions other than malignant neoplasm: Secondary | ICD-10-CM | POA: Diagnosis not present

## 2022-01-14 DIAGNOSIS — Z86010 Personal history of colonic polyps: Secondary | ICD-10-CM | POA: Diagnosis not present

## 2022-01-21 DIAGNOSIS — B001 Herpesviral vesicular dermatitis: Secondary | ICD-10-CM | POA: Diagnosis not present

## 2022-01-21 DIAGNOSIS — Z6830 Body mass index (BMI) 30.0-30.9, adult: Secondary | ICD-10-CM | POA: Diagnosis not present

## 2022-01-24 DIAGNOSIS — Z0000 Encounter for general adult medical examination without abnormal findings: Secondary | ICD-10-CM | POA: Diagnosis not present

## 2022-01-24 DIAGNOSIS — E785 Hyperlipidemia, unspecified: Secondary | ICD-10-CM | POA: Diagnosis not present

## 2022-01-24 DIAGNOSIS — Z23 Encounter for immunization: Secondary | ICD-10-CM | POA: Diagnosis not present

## 2022-01-24 DIAGNOSIS — I10 Essential (primary) hypertension: Secondary | ICD-10-CM | POA: Diagnosis not present

## 2022-01-24 NOTE — Progress Notes (Signed)
Chief Complaint:   OBESITY Stephanie White is here to discuss her progress with her obesity treatment plan along with follow-up of her obesity related diagnoses. Stephanie White is on the Category 2 Plan and states she is following her eating plan approximately 80% of the time. Stephanie White states she is going to the gym 20 minutes 4 times per week.  Today's visit was #: 2 Starting weight: 181 lbs Starting date: 12/16/2021 Today's weight: 180 lbs Today's date: 12/30/2021 Total lbs lost to date: 1 lb Total lbs lost since last in-office visit: 1  Interim History: Stephanie White did not feel anything changed drastically. Does not eat a lot of bread and does not want to eat deli meat. Was getting all protein in at lunch. Not sure if she ate more than she did previously for supper. Not able to get full amount in at supper. Wants to eat more earlier in the day. Getting together with family for Thanksgiving.  Subjective:   1. Vitamin D deficiency Stephanie White is on Vit D over the counter (combo Vit D/ca). Notes fatigue.  2. Prediabetes Last A1c was 5.7, Insulin 7.3. New diagnoses.  3. Other hyperlipidemia Stephanie White is not on medications. LDL of 116, HDL of 39, Trigly of 136.  Assessment/Plan:   1. Vitamin D deficiency Start over the counter Vit D 5K IU daily. Will follow up Vit D level in 3 months--goal of 60-80.  2. Prediabetes Pathophysiology of IR, Prediabetes, Diabetes discussed. No medications at this time--may need to revisit in the future in cravings increase.  3. Other hyperlipidemia Cat 2 plan;Repeat labs in 3 months.  4. Obesity with current BMI of 32.0 Stephanie White is currently in the action stage of change. As such, her goal is to continue with weight loss efforts. She has agreed to the Category 2 Plan.   Exercise goals: As is.  Behavioral modification strategies: increasing lean protein intake, meal planning and cooking strategies, keeping healthy foods in the home, and planning for success.  Stephanie White has  agreed to follow-up with our clinic in 2 weeks. She was informed of the importance of frequent follow-up visits to maximize her success with intensive lifestyle modifications for her multiple health conditions.   Objective:   Blood pressure 138/67, pulse (!) 50, temperature 97.9 F (36.6 C), height '5\' 3"'$  (1.6 m), weight 180 lb (81.6 kg), SpO2 98 %. Body mass index is 31.89 kg/m.  General: Cooperative, alert, well developed, in no acute distress. HEENT: Conjunctivae and lids unremarkable. Cardiovascular: Regular rhythm.  Lungs: Normal work of breathing. Neurologic: No focal deficits.   Lab Results  Component Value Date   CREATININE 0.79 01/03/2020   BUN 20 01/03/2020   NA 138 01/03/2020   K 4.3 01/03/2020   CL 100 01/03/2020   CO2 25 01/03/2020   Lab Results  Component Value Date   ALT 21 10/16/2015   AST 19 10/16/2015   ALKPHOS 54 10/16/2015   BILITOT 0.4 10/16/2015   Lab Results  Component Value Date   HGBA1C 5.7 (H) 12/16/2021   Lab Results  Component Value Date   INSULIN 7.3 12/16/2021   Lab Results  Component Value Date   TSH 1.940 12/16/2021   Lab Results  Component Value Date   CHOL 179 12/16/2021   HDL 39 (L) 12/16/2021   LDLCALC 116 (H) 12/16/2021   TRIG 136 12/16/2021   CHOLHDL 3.8 10/16/2015   Lab Results  Component Value Date   VD25OH 39.8 12/16/2021   Lab Results  Component  Value Date   WBC 3.2 (L) 10/16/2015   HGB 12.4 10/16/2015   HCT 37.5 10/16/2015   MCV 86.6 10/16/2015   PLT 235 10/16/2015   Lab Results  Component Value Date   IRON 35 (L) 07/04/2011   TIBC 330 07/04/2011   FERRITIN 40 07/04/2011   Attestation Statements:   Reviewed by clinician on day of visit: allergies, medications, problem list, medical history, surgical history, family history, social history, and previous encounter notes.  Time spent on visit including pre-visit chart review and post-visit care and charting was 45 minutes.   I, Brendell Tyus, am acting  as transcriptionist for [employer], PA.  I have reviewed the above documentation for accuracy and completeness, and I agree with the above. - Coralie Common, MD

## 2022-01-27 NOTE — Progress Notes (Signed)
Chief Complaint:   OBESITY Stephanie White is here to discuss her progress with her obesity treatment plan along with follow-up of her obesity related diagnoses. Stephanie White is on the Category 2 Plan and states she is following her eating plan approximately 95% of the time. Stephanie White states she is doing body pump for 60 minutes 2 times per week, flow yoga for 60 minutes 2 times per week, Zumba for 60 minutes 3 times per week.  Today's visit was #: 3 Starting weight: 181 Starting date: 12/16/21 Today's weight: 182 lbs Today's date: 01/13/22 Total lbs lost to date: 0 Total lbs lost since last in-office visit: +2  Interim History: Patient just got back from vacation 2 days ago (drove 12 hours).  While away she did occasionally indulge in bacon.  Did also eat a [place] sandwich and numerous food samples.  Does not feel she overate.  Yesterday she got back to exercise.  Has colonoscopy coming up in 2 days.  No upcoming plans for travel or events.  Subjective:   1. Prediabetes A1c 5.7, insulin 7.3. Not on medications. Some occasional carb cravings.  Assessment/Plan:   1. Prediabetes Labs in 3 months.  2. Obesity with current BMI of 32.2 Cincere is currently in the action stage of change. As such, her goal is to continue with weight loss efforts. She has agreed to the Category 2 Plan.   Exercise goals: All adults should avoid inactivity. Some physical activity is better than none, and adults who participate in any amount of physical activity gain some health benefits.  Behavioral modification strategies: increasing lean protein intake, meal planning and cooking strategies, keeping healthy foods in the home, and holiday eating strategies .  Stephanie White has agreed to follow-up with our clinic in 3 weeks. She was informed of the importance of frequent follow-up visits to maximize her success with intensive lifestyle modifications for her multiple health conditions.    Objective:   Blood pressure 129/70,  pulse (!) 52, temperature 98.2 F (36.8 C), height '5\' 3"'$  (1.6 m), weight 182 lb (82.6 kg), SpO2 99 %. Body mass index is 32.24 kg/m.  General: Cooperative, alert, well developed, in no acute distress. HEENT: Conjunctivae and lids unremarkable. Cardiovascular: Regular rhythm.  Lungs: Normal work of breathing. Neurologic: No focal deficits.   Lab Results  Component Value Date   CREATININE 0.79 01/03/2020   BUN 20 01/03/2020   NA 138 01/03/2020   K 4.3 01/03/2020   CL 100 01/03/2020   CO2 25 01/03/2020   Lab Results  Component Value Date   ALT 21 10/16/2015   AST 19 10/16/2015   ALKPHOS 54 10/16/2015   BILITOT 0.4 10/16/2015   Lab Results  Component Value Date   HGBA1C 5.7 (H) 12/16/2021   Lab Results  Component Value Date   INSULIN 7.3 12/16/2021   Lab Results  Component Value Date   TSH 1.940 12/16/2021   Lab Results  Component Value Date   CHOL 179 12/16/2021   HDL 39 (L) 12/16/2021   LDLCALC 116 (H) 12/16/2021   TRIG 136 12/16/2021   CHOLHDL 3.8 10/16/2015   Lab Results  Component Value Date   VD25OH 39.8 12/16/2021   Lab Results  Component Value Date   WBC 3.2 (L) 10/16/2015   HGB 12.4 10/16/2015   HCT 37.5 10/16/2015   MCV 86.6 10/16/2015   PLT 235 10/16/2015   Lab Results  Component Value Date   IRON 35 (L) 07/04/2011   TIBC 330 07/04/2011  FERRITIN 40 07/04/2011    Attestation Statements:   Reviewed by clinician on day of visit: allergies, medications, problem list, medical history, surgical history, family history, social history, and previous encounter notes.  I, Dawn Whitmire, FNP-C, am acting as Location manager for Coralie Common, MD.  I have reviewed the above documentation for accuracy and completeness, and I agree with the above. - Coralie Common, MD

## 2022-02-02 ENCOUNTER — Ambulatory Visit (INDEPENDENT_AMBULATORY_CARE_PROVIDER_SITE_OTHER): Payer: BC Managed Care – PPO | Admitting: Family Medicine

## 2022-02-02 ENCOUNTER — Encounter (INDEPENDENT_AMBULATORY_CARE_PROVIDER_SITE_OTHER): Payer: Self-pay | Admitting: Family Medicine

## 2022-02-02 VITALS — BP 154/78 | HR 53 | Temp 97.8°F | Ht 63.0 in | Wt 178.0 lb

## 2022-02-02 DIAGNOSIS — Z6831 Body mass index (BMI) 31.0-31.9, adult: Secondary | ICD-10-CM | POA: Diagnosis not present

## 2022-02-02 DIAGNOSIS — E7849 Other hyperlipidemia: Secondary | ICD-10-CM | POA: Diagnosis not present

## 2022-02-02 DIAGNOSIS — E669 Obesity, unspecified: Secondary | ICD-10-CM | POA: Diagnosis not present

## 2022-02-21 NOTE — Progress Notes (Signed)
Chief Complaint:   OBESITY Stephanie White is here to discuss her progress with her obesity treatment plan along with follow-up of her obesity related diagnoses. Stephanie White is on the Category 2 Plan and states she is following her eating plan approximately 90% of the time. Stephanie White states she is doing Zumba/body pump/yoga ? minutes 2 times per week.  Today's visit was #: 4 Starting weight: 181 lbs Starting date: 12/16/2021 Today's weight: 178 lbs Today's date: 02/02/2022 Total lbs lost to date: 3 lbs Total lbs lost since last in-office visit: 4  Interim History: Ketra had shingles last week and is recovering.  Did not exercise at all due to pain.  She did increase her protein particularly at dinner.  Still eating salad and decreasing sugar intake (ex sweet tea).  She is on control of food choices at Christmas.  Subjective:   1. Other hyperlipidemia LDL of 116 on initial lab draw.  Lonia is not on medication  Assessment/Plan:   1. Other hyperlipidemia Will repeat labs in February.  2. Obesity with current BMI of 31.7 Tramaine is currently in the action stage of change. As such, her goal is to continue with weight loss efforts. She has agreed to the Category 2 Plan.   Exercise goals: All adults should avoid inactivity. Some physical activity is better than none, and adults who participate in any amount of physical activity gain some health benefits.  Behavioral modification strategies: increasing lean protein intake, meal planning and cooking strategies, keeping healthy foods in the home, and planning for success.  Teanna has agreed to follow-up with our clinic in as needed. She was informed of the importance of frequent follow-up visits to maximize her success with intensive lifestyle modifications for her multiple health conditions.   Objective:   Blood pressure (!) 154/78, pulse (!) 53, temperature 97.8 F (36.6 C), height '5\' 3"'$  (1.6 m), weight 178 lb (80.7 kg), SpO2 100 %. Body mass  index is 31.53 kg/m.  General: Cooperative, alert, well developed, in no acute distress. HEENT: Conjunctivae and lids unremarkable. Cardiovascular: Regular rhythm.  Lungs: Normal work of breathing. Neurologic: No focal deficits.   Lab Results  Component Value Date   CREATININE 0.79 01/03/2020   BUN 20 01/03/2020   NA 138 01/03/2020   K 4.3 01/03/2020   CL 100 01/03/2020   CO2 25 01/03/2020   Lab Results  Component Value Date   ALT 21 10/16/2015   AST 19 10/16/2015   ALKPHOS 54 10/16/2015   BILITOT 0.4 10/16/2015   Lab Results  Component Value Date   HGBA1C 5.7 (H) 12/16/2021   Lab Results  Component Value Date   INSULIN 7.3 12/16/2021   Lab Results  Component Value Date   TSH 1.940 12/16/2021   Lab Results  Component Value Date   CHOL 179 12/16/2021   HDL 39 (L) 12/16/2021   LDLCALC 116 (H) 12/16/2021   TRIG 136 12/16/2021   CHOLHDL 3.8 10/16/2015   Lab Results  Component Value Date   VD25OH 39.8 12/16/2021   Lab Results  Component Value Date   WBC 3.2 (L) 10/16/2015   HGB 12.4 10/16/2015   HCT 37.5 10/16/2015   MCV 86.6 10/16/2015   PLT 235 10/16/2015   Lab Results  Component Value Date   IRON 35 (L) 07/04/2011   TIBC 330 07/04/2011   FERRITIN 40 07/04/2011   Attestation Statements:   Reviewed by clinician on day of visit: allergies, medications, problem list, medical history, surgical history,  family history, social history, and previous encounter notes.  I, Elnora Morrison, RMA am acting as transcriptionist for Coralie Common, MD.  I have reviewed the above documentation for accuracy and completeness, and I agree with the above. - Coralie Common, MD

## 2022-06-30 ENCOUNTER — Other Ambulatory Visit: Payer: Self-pay | Admitting: Physician Assistant

## 2022-06-30 DIAGNOSIS — Z1231 Encounter for screening mammogram for malignant neoplasm of breast: Secondary | ICD-10-CM

## 2022-08-01 DIAGNOSIS — H2513 Age-related nuclear cataract, bilateral: Secondary | ICD-10-CM | POA: Diagnosis not present

## 2022-08-01 DIAGNOSIS — H25041 Posterior subcapsular polar age-related cataract, right eye: Secondary | ICD-10-CM | POA: Diagnosis not present

## 2022-08-01 DIAGNOSIS — H25013 Cortical age-related cataract, bilateral: Secondary | ICD-10-CM | POA: Diagnosis not present

## 2022-08-15 ENCOUNTER — Ambulatory Visit
Admission: RE | Admit: 2022-08-15 | Discharge: 2022-08-15 | Disposition: A | Discharge status: Home / Self Care | Payer: BC Managed Care – PPO | Source: Ambulatory Visit | Attending: Physician Assistant | Admitting: Physician Assistant

## 2022-08-15 DIAGNOSIS — Z1231 Encounter for screening mammogram for malignant neoplasm of breast: Secondary | ICD-10-CM

## 2022-08-26 DIAGNOSIS — H25811 Combined forms of age-related cataract, right eye: Secondary | ICD-10-CM | POA: Diagnosis not present

## 2022-08-26 DIAGNOSIS — Z01818 Encounter for other preprocedural examination: Secondary | ICD-10-CM | POA: Diagnosis not present

## 2022-09-07 DIAGNOSIS — H25811 Combined forms of age-related cataract, right eye: Secondary | ICD-10-CM | POA: Diagnosis not present

## 2022-10-12 DIAGNOSIS — H25812 Combined forms of age-related cataract, left eye: Secondary | ICD-10-CM | POA: Diagnosis not present

## 2022-10-18 ENCOUNTER — Other Ambulatory Visit: Payer: Self-pay | Admitting: Cardiovascular Disease

## 2022-10-18 NOTE — Telephone Encounter (Signed)
Rx request sent to pharmacy.  

## 2022-11-11 NOTE — Progress Notes (Incomplete)
Cardiology Office Note:  .    Date:  11/14/2022  ID:  Stephanie White, DOB 12-19-1960, MRN [id number] PCP: Milus Height, PA  [facility] Providers Cardiologist:  Chilton Si, MD     History of Present Illness: .    Stephanie White is a 62 y.o. female with hypertension, hypokalemia, PVCs and hyperlipidemia who presents for follow up.  [name] was initially seen for palpitations. Her palpitations were felt to be due to PVCs in the setting of hypokalemia. Spironolactone was added to her medical regimen to help with both hypokalemia and blood pressure.  Serum renin and aldosterone levels were checked and were both normal.  She was referred for a treadmill stress test 06/2015 that revealed no ischemia but she did have a hypertensive response to exercise.  She exercised for 7 minutes on a Bruce protocol, which is 9 METS. Her peak blood pressure was 163/100.  She developed lower extremity edema on amlodipine but has been able to tolerate 2.5 mg daily.  [name] has a renal artery ultrasound that was negative for stenosis.   Her blood pressure remained elevated so doxazosin and spironolactone were increased.     At her visit 10/2021, she was feeling well and exercising routinely but noted difficulty with losing weight. [facility] was [facility] to [facility]. In the office her pulse was 48 bpm but she was asymptomatic. Home blood pressures consistently ranged from 110s-120s. She complained of nocturia which she attributed to spironolactone. We reduced her spironolactone to 25 mg.  Today, she states she is travelling and enjoying retirement. She has no new cardiovascular concerns at this time. In the office her blood pressure is 132/68. She monitors her blood pressure about twice per month and her home readings have also been stable. EKG performed today showed sinus bradycardia at 55 bpm with nonspecific T wave abnormalities. For exercise she is maintaining a  routine of classes at the gym 4 days per week. She is also participating in yoga twice a week and stays active at home. No anginal symptoms. She states her diet has been good lately. It is still difficult for her to lose weight despite adhering to her diet and exercise, but her weight remains stable as well. We reviewed her lipid panel 12/2021 showing LDL 116, HDL 39 and discussed coronary calcium scoring. She denies any palpitations, chest pain, shortness of breath, peripheral edema, lightheadedness, headaches, syncope, orthopnea, or PND.  ROS:  Please see the history of present illness. All other systems are reviewed and negative.   Studies Reviewed: Marland Kitchen   EKG Interpretation Date/Time:  Monday November 14 2022 09:05:40 EDT Ventricular Rate:  55 PR Interval:  178 QRS Duration:  76 QT Interval:  400 QTC Calculation: 382 R Axis:   7  Text Interpretation: Sinus bradycardia Nonspecific T wave abnormality Since last tracing Premature ventricular complexes NO LONGER PRESENT Confirmed by Chilton Si ([id number]) on 11/14/2022 9:14:00 AM    Risk Assessment/Calculations:             Physical Exam:    VS:  BP 132/68   Pulse (!) 55   Ht 5\' 3"  (1.6 m)   Wt 184 lb (83.5 kg)   SpO2 98%   BMI 32.59 kg/m  , BMI Body mass index is 32.59 kg/m. GENERAL:  Well appearing HEENT: Pupils equal round and reactive, fundi not visualized, oral mucosa unremarkable NECK:  No jugular venous distention, waveform within normal limits, carotid upstroke brisk and symmetric, no bruits,  no thyromegaly LUNGS:  Clear to auscultation bilaterally HEART:  RRR.  PMI not displaced or sustained,S1 and S2 within normal limits, no S3, no S4, no clicks, no rubs, no murmurs ABD:  Flat, positive bowel sounds normal in frequency in pitch, no bruits, no rebound, no guarding, no midline pulsatile mass, no hepatomegaly, no splenomegaly EXT:  2 plus pulses throughout, no edema, no cyanosis no clubbing SKIN:  No rashes no  nodules NEURO:  Cranial nerves II through XII grossly intact, motor grossly intact throughout PSYCH:  Cognitively intact, oriented to person place and time  Wt Readings from Last 3 Encounters:  11/14/22 184 lb (83.5 kg)  02/02/22 178 lb (80.7 kg)  01/13/22 182 lb (82.6 kg)     ASSESSMENT AND PLAN: .    # Hypertension Well controlled on current regimen of bisoprolol and spironolactone. No reported side effects. Home blood pressure readings are within normal range. -Continue current medication regimen. -Renew prescriptions for 90 days.  # Premature Ventricular Contractions (PVCs) No PVCs noted on today's EKG. No reported symptoms of palpitations. -Continue current medication regimen.  # Hyperlipidemia ASCVD 10 year risk 8.7%.  Cholesterol levels were within normal range for someone without heart disease. However, patient's LDL was borderline and HDL was slightly low. Discussed the possibility of a calcium score to assess for plaque development in the heart arteries and LDL goal.  -Order a calcium score to assess for plaque development.  # Obestiy:  #Weight Management: Patient has been stable with her weight. She is active and exercises regularly. [facility] but struggled to eat as much as they recommended.  Now unable to go due to insurance change. -Continue current lifestyle modifications. -Limit carbs and continue regular exercise  Follow-up in 1 year or sooner if any new symptoms arise.        I,Mathew Stumpf,acting as a Neurosurgeon for Chilton Si, MD.,have documented all relevant documentation on the behalf of Chilton Si, MD,as directed by  Chilton Si, MD while in the presence of Chilton Si, MD.  I, Tiffany C. Duke Salvia, MD have reviewed all documentation for this visit.  The documentation of the exam, diagnosis, procedures, and orders on 11/14/2022 are all accurate and complete.   Signed, Chilton Si, MD

## 2022-11-14 ENCOUNTER — Ambulatory Visit (INDEPENDENT_AMBULATORY_CARE_PROVIDER_SITE_OTHER): Payer: BC Managed Care – PPO | Admitting: Cardiovascular Disease

## 2022-11-14 ENCOUNTER — Encounter (HOSPITAL_BASED_OUTPATIENT_CLINIC_OR_DEPARTMENT_OTHER): Payer: Self-pay | Admitting: Cardiovascular Disease

## 2022-11-14 ENCOUNTER — Encounter (HOSPITAL_BASED_OUTPATIENT_CLINIC_OR_DEPARTMENT_OTHER): Payer: Self-pay | Admitting: *Deleted

## 2022-11-14 VITALS — BP 132/68 | HR 55 | Ht 63.0 in | Wt 184.0 lb

## 2022-11-14 DIAGNOSIS — I10 Essential (primary) hypertension: Secondary | ICD-10-CM | POA: Diagnosis not present

## 2022-11-14 DIAGNOSIS — I493 Ventricular premature depolarization: Secondary | ICD-10-CM | POA: Diagnosis not present

## 2022-11-14 DIAGNOSIS — Z01419 Encounter for gynecological examination (general) (routine) without abnormal findings: Secondary | ICD-10-CM | POA: Diagnosis not present

## 2022-11-14 MED ORDER — BISOPROLOL FUMARATE 10 MG PO TABS: 10.0000 mg | ORAL_TABLET | Freq: Every day | ORAL | 0 refills | Status: AC

## 2022-11-14 MED ORDER — SPIRONOLACTONE 50 MG PO TABS: 50.0000 mg | ORAL_TABLET | Freq: Every day | ORAL | 3 refills | Status: DC

## 2022-11-14 NOTE — Patient Instructions (Addendum)
Medication Instructions:  Your physician recommends that you continue on your current medications as directed. Please refer to the Current Medication list given to you today.  *If you need a refill on your cardiac medications before your next appointment, please call your pharmacy*  Lab Work: NONE  Testing/Procedures: CALCIUM SCORE - THIS WILL COST $99 OUT OF POCKET  Follow-Up: At [facility], you and your health needs are our priority.  As part of our continuing mission to provide you with exceptional heart care, we have created designated Provider Care Teams.  These Care Teams include your primary Cardiologist (physician) and Advanced Practice Providers (APPs -  Physician Assistants and Nurse Practitioners) who all work together to provide you with the care you need, when you need it.  We recommend signing up for the patient portal called "MyChart".  Sign up information is provided on this After Visit Summary.  MyChart is used to connect with patients for Virtual Visits (Telemedicine).  Patients are able to view lab/test results, encounter notes, upcoming appointments, etc.  Non-urgent messages can be sent to your provider as well.   To learn more about what you can do with MyChart, go to [email or web address].    Your next appointment:   12 month(s)  Provider:   Chilton Si, MD or Gillian Shields, NP

## 2022-11-14 NOTE — Progress Notes (Signed)
Cardiology Office Note:  .    Date:  11/14/2022  ID:  Stephanie White, DOB 1961-01-12, MRN [id number] PCP: Milus Height, PA  [facility] Providers Cardiologist:  Chilton Si, MD     History of Present Illness: .    Stephanie White is a 62 y.o. female with hypertension, hypokalemia, PVCs and hyperlipidemia who presents for follow up.  Stephanie White was initially seen for palpitations. Her palpitations were felt to be due to PVCs in the setting of hypokalemia. Spironolactone was added to her medical regimen to help with both hypokalemia and blood pressure.  Serum renin and aldosterone levels were checked and were both normal.  She was referred for a treadmill stress test 06/2015 that revealed no ischemia but she did have a hypertensive response to exercise.  She exercised for 7 minutes on a Bruce protocol, which is 9 METS. Her peak blood pressure was 163/100.  She developed lower extremity edema on amlodipine but has been able to tolerate 2.5 mg daily.  Stephanie White has a renal artery ultrasound that was negative for stenosis.   Her blood pressure remained elevated so doxazosin and spironolactone were increased.     At her visit 10/2021, she was feeling well and exercising routinely but noted difficulty with losing weight. She was referred to [facility]. In the office her pulse was 48 bpm but she was asymptomatic. Home blood pressures consistently ranged from 110s-120s. She complained of nocturia which she attributed to spironolactone. We reduced her spironolactone to 25 mg.  Today, she states she is travelling and enjoying retirement. She has no new cardiovascular concerns at this time. In the office her blood pressure is 132/68. She monitors her blood pressure about twice per month and her home readings have also been stable. EKG performed today showed sinus bradycardia at 55 bpm with nonspecific T wave abnormalities. For exercise she is maintaining a  routine of classes at the gym 4 days per week. She is also participating in yoga twice a week and stays active at home. No anginal symptoms. She states her diet has been good lately. It is still difficult for her to lose weight despite adhering to her diet and exercise, but her weight remains stable as well. We reviewed her lipid panel 12/2021 showing LDL 116, HDL 39 and discussed coronary calcium scoring. She denies any palpitations, chest pain, shortness of breath, peripheral edema, lightheadedness, headaches, syncope, orthopnea, or PND.  ROS:  Please see the history of present illness. All other systems are reviewed and negative.   Studies Reviewed: Marland Kitchen   EKG Interpretation Date/Time:  Monday November 14 2022 09:05:40 EDT Ventricular Rate:  55 PR Interval:  178 QRS Duration:  76 QT Interval:  400 QTC Calculation: 382 R Axis:   7  Text Interpretation: Sinus bradycardia Nonspecific T wave abnormality Since last tracing Premature ventricular complexes NO LONGER PRESENT Confirmed by Chilton Si ([id number]) on 11/14/2022 9:14:00 AM    Risk Assessment/Calculations:             Physical Exam:    VS:  BP 132/68   Pulse (!) 55   Ht 5\' 3"  (1.6 m)   Wt 184 lb (83.5 kg)   SpO2 98%   BMI 32.59 kg/m  , BMI Body mass index is 32.59 kg/m. GENERAL:  Well appearing HEENT: Pupils equal round and reactive, fundi not visualized, oral mucosa unremarkable NECK:  No jugular venous distention, waveform within normal limits, carotid upstroke brisk and symmetric, no bruits,  no thyromegaly LUNGS:  Clear to auscultation bilaterally HEART:  RRR.  PMI not displaced or sustained,S1 and S2 within normal limits, no S3, no S4, no clicks, no rubs, no murmurs ABD:  Flat, positive bowel sounds normal in frequency in pitch, no bruits, no rebound, no guarding, no midline pulsatile mass, no hepatomegaly, no splenomegaly EXT:  2 plus pulses throughout, no edema, no cyanosis no clubbing SKIN:  No rashes no  nodules NEURO:  Cranial nerves II through XII grossly intact, motor grossly intact throughout PSYCH:  Cognitively intact, oriented to person place and time  Wt Readings from Last 3 Encounters:  11/14/22 184 lb (83.5 kg)  02/02/22 178 lb (80.7 kg)  01/13/22 182 lb (82.6 kg)     ASSESSMENT AND PLAN: .    # Hypertension Well controlled on current regimen of bisoprolol and spironolactone. No reported side effects. Home blood pressure readings are within normal range. -Continue current medication regimen. -Renew prescriptions for 90 days.  # Premature Ventricular Contractions (PVCs) No PVCs noted on today's EKG. No reported symptoms of palpitations. -Continue current medication regimen.  # Hyperlipidemia ASCVD 10 year risk 8.7%.  Cholesterol levels were within normal range for someone without heart disease. However, patient's LDL was borderline and HDL was slightly low. Discussed the possibility of a calcium score to assess for plaque development in the heart arteries and LDL goal.  -Order a calcium score to assess for plaque development.  # Obestiy:  #Weight Management: Patient has been stable with her weight. She is active and exercises regularly. [facility] but struggled to eat as much as they recommended.  Now unable to go due to insurance change. -Continue current lifestyle modifications. -Limit carbs and continue regular exercise  Follow-up in 1 year or sooner if any new symptoms arise.        I,Mathew Stumpf,acting as a Neurosurgeon for Chilton Si, MD.,have documented all relevant documentation on the behalf of Chilton Si, MD,as directed by  Chilton Si, MD while in the presence of Chilton Si, MD.  I, Joua Bake C. Duke Salvia, MD have reviewed all documentation for this visit.  The documentation of the exam, diagnosis, procedures, and orders on 11/14/2022 are all accurate and complete.   Signed, Chilton Si, MD

## 2022-12-19 ENCOUNTER — Ambulatory Visit (HOSPITAL_BASED_OUTPATIENT_CLINIC_OR_DEPARTMENT_OTHER)
Admission: RE | Admit: 2022-12-19 | Discharge: 2022-12-19 | Disposition: A | Discharge status: Home / Self Care | Payer: BC Managed Care – PPO | Source: Ambulatory Visit | Attending: Cardiovascular Disease | Admitting: Cardiovascular Disease

## 2022-12-19 DIAGNOSIS — I10 Essential (primary) hypertension: Secondary | ICD-10-CM | POA: Insufficient documentation

## 2023-02-01 DIAGNOSIS — I10 Essential (primary) hypertension: Secondary | ICD-10-CM | POA: Diagnosis not present

## 2023-02-01 DIAGNOSIS — R7303 Prediabetes: Secondary | ICD-10-CM | POA: Diagnosis not present

## 2023-02-01 DIAGNOSIS — Z0000 Encounter for general adult medical examination without abnormal findings: Secondary | ICD-10-CM | POA: Diagnosis not present

## 2023-02-01 DIAGNOSIS — E785 Hyperlipidemia, unspecified: Secondary | ICD-10-CM | POA: Diagnosis not present

## 2023-02-01 DIAGNOSIS — F430 Acute stress reaction: Secondary | ICD-10-CM | POA: Diagnosis not present

## 2023-11-25 ENCOUNTER — Other Ambulatory Visit (HOSPITAL_BASED_OUTPATIENT_CLINIC_OR_DEPARTMENT_OTHER): Payer: Self-pay | Admitting: Cardiovascular Disease
